# Patient Record
Sex: Female | Born: 1970 | Race: White | Hispanic: No | Marital: Married | Smoking: Never smoker
Health system: Southern US, Community
[De-identification: ages and names within clinical notes are randomized; demographics above are authoritative.]

## PROBLEM LIST (undated history)

## (undated) DIAGNOSIS — L853 Xerosis cutis: Secondary | ICD-10-CM

## (undated) DIAGNOSIS — F39 Unspecified mood [affective] disorder: Secondary | ICD-10-CM

## (undated) DIAGNOSIS — Z9889 Other specified postprocedural states: Secondary | ICD-10-CM

## (undated) DIAGNOSIS — R112 Nausea with vomiting, unspecified: Secondary | ICD-10-CM

## (undated) DIAGNOSIS — B009 Herpesviral infection, unspecified: Secondary | ICD-10-CM

## (undated) DIAGNOSIS — Z72820 Sleep deprivation: Secondary | ICD-10-CM

## (undated) DIAGNOSIS — Z860100 Personal history of colon polyps, unspecified: Secondary | ICD-10-CM

## (undated) DIAGNOSIS — E079 Disorder of thyroid, unspecified: Secondary | ICD-10-CM

## (undated) HISTORY — PX: WISDOM TOOTH EXTRACTION: SHX21

## (undated) HISTORY — DX: Personal history of colon polyps, unspecified: Z86.0100

## (undated) HISTORY — DX: Other specified postprocedural states: Z98.890

## (undated) HISTORY — DX: Herpesviral infection, unspecified: B00.9

## (undated) HISTORY — DX: Nausea with vomiting, unspecified: R11.2

## (undated) HISTORY — PX: BREAST ENHANCEMENT SURGERY: SHX7

## (undated) HISTORY — DX: Unspecified mood (affective) disorder: F39

## (undated) HISTORY — DX: Xerosis cutis: L85.3

## (undated) HISTORY — DX: Personal history of colonic polyps: Z86.010

## (undated) HISTORY — PX: NASAL POLYP SURGERY: SHX186

## (undated) HISTORY — DX: Disorder of thyroid, unspecified: E07.9

## (undated) HISTORY — DX: Sleep deprivation: Z72.820

---

## 1994-07-04 HISTORY — PX: OTHER SURGICAL HISTORY: SHX169

## 1996-07-04 HISTORY — PX: OTHER SURGICAL HISTORY: SHX169

## 1998-08-10 ENCOUNTER — Other Ambulatory Visit: Admission: RE | Admit: 1998-08-10 | Discharge: 1998-08-10 | Payer: Self-pay | Admitting: Obstetrics and Gynecology

## 1998-11-13 ENCOUNTER — Other Ambulatory Visit: Admission: RE | Admit: 1998-11-13 | Discharge: 1998-11-13 | Payer: Self-pay | Admitting: Obstetrics and Gynecology

## 2000-01-27 ENCOUNTER — Other Ambulatory Visit: Admission: RE | Admit: 2000-01-27 | Discharge: 2000-01-27 | Payer: Self-pay | Admitting: Obstetrics and Gynecology

## 2000-09-07 ENCOUNTER — Inpatient Hospital Stay (HOSPITAL_COMMUNITY): Admission: AD | Admit: 2000-09-07 | Discharge: 2000-09-11 | Payer: Self-pay | Admitting: Obstetrics and Gynecology

## 2000-11-13 ENCOUNTER — Ambulatory Visit (HOSPITAL_BASED_OUTPATIENT_CLINIC_OR_DEPARTMENT_OTHER): Admission: RE | Admit: 2000-11-13 | Discharge: 2000-11-13 | Payer: Self-pay | Admitting: Specialist

## 2000-11-13 ENCOUNTER — Encounter (INDEPENDENT_AMBULATORY_CARE_PROVIDER_SITE_OTHER): Payer: Self-pay | Admitting: *Deleted

## 2002-09-04 ENCOUNTER — Other Ambulatory Visit: Admission: RE | Admit: 2002-09-04 | Discharge: 2002-09-04 | Payer: Self-pay | Admitting: Obstetrics and Gynecology

## 2002-09-05 ENCOUNTER — Other Ambulatory Visit: Admission: RE | Admit: 2002-09-05 | Discharge: 2002-09-05 | Payer: Self-pay | Admitting: Obstetrics and Gynecology

## 2003-03-14 ENCOUNTER — Other Ambulatory Visit: Admission: RE | Admit: 2003-03-14 | Discharge: 2003-03-14 | Payer: Self-pay | Admitting: Obstetrics and Gynecology

## 2004-03-25 ENCOUNTER — Other Ambulatory Visit: Admission: RE | Admit: 2004-03-25 | Discharge: 2004-03-25 | Payer: Self-pay | Admitting: Obstetrics and Gynecology

## 2004-09-08 ENCOUNTER — Ambulatory Visit: Payer: Self-pay | Admitting: Internal Medicine

## 2004-09-20 ENCOUNTER — Ambulatory Visit: Payer: Self-pay | Admitting: Internal Medicine

## 2004-12-13 ENCOUNTER — Ambulatory Visit: Payer: Self-pay | Admitting: Internal Medicine

## 2004-12-20 ENCOUNTER — Ambulatory Visit: Payer: Self-pay | Admitting: Internal Medicine

## 2005-02-24 ENCOUNTER — Other Ambulatory Visit: Admission: RE | Admit: 2005-02-24 | Discharge: 2005-02-24 | Payer: Self-pay | Admitting: Obstetrics and Gynecology

## 2005-08-05 ENCOUNTER — Ambulatory Visit: Payer: Self-pay | Admitting: Internal Medicine

## 2005-10-25 ENCOUNTER — Ambulatory Visit: Payer: Self-pay | Admitting: Internal Medicine

## 2006-06-01 ENCOUNTER — Ambulatory Visit: Payer: Self-pay | Admitting: Internal Medicine

## 2006-08-09 ENCOUNTER — Ambulatory Visit: Payer: Self-pay | Admitting: Internal Medicine

## 2006-12-01 ENCOUNTER — Ambulatory Visit: Payer: Self-pay | Admitting: Internal Medicine

## 2006-12-01 LAB — CONVERTED CEMR LAB
ALT: 14 units/L (ref 0–40)
AST: 20 units/L (ref 0–37)
Albumin: 4.2 g/dL (ref 3.5–5.2)
Basophils Relative: 0 % (ref 0.0–1.0)
Bilirubin, Direct: 0.1 mg/dL (ref 0.0–0.3)
Calcium: 9.4 mg/dL (ref 8.4–10.5)
Glucose, Bld: 94 mg/dL (ref 70–99)
HCT: 40.1 % (ref 36.0–46.0)
Hemoglobin: 13.8 g/dL (ref 12.0–15.0)
Lymphocytes Relative: 43.8 % (ref 12.0–46.0)
MCHC: 34.4 g/dL (ref 30.0–36.0)
MCV: 94.5 fL (ref 78.0–100.0)
Monocytes Absolute: 0.3 10*3/uL (ref 0.2–0.7)
Neutro Abs: 1.3 10*3/uL — ABNORMAL LOW (ref 1.4–7.7)
Potassium: 4.2 meq/L (ref 3.5–5.1)
RBC: 4.24 M/uL (ref 3.87–5.11)
Sodium: 143 meq/L (ref 135–145)
TSH: 2.83 microintl units/mL (ref 0.35–5.50)
Total Bilirubin: 0.7 mg/dL (ref 0.3–1.2)
Total Protein: 7.1 g/dL (ref 6.0–8.3)

## 2006-12-11 ENCOUNTER — Ambulatory Visit: Payer: Self-pay | Admitting: Internal Medicine

## 2007-03-02 ENCOUNTER — Ambulatory Visit: Payer: Self-pay | Admitting: Internal Medicine

## 2007-03-02 DIAGNOSIS — M545 Low back pain, unspecified: Secondary | ICD-10-CM | POA: Insufficient documentation

## 2007-03-02 DIAGNOSIS — L509 Urticaria, unspecified: Secondary | ICD-10-CM | POA: Insufficient documentation

## 2007-03-02 LAB — CONVERTED CEMR LAB
Bilirubin Urine: NEGATIVE
Glucose, Urine, Semiquant: NEGATIVE
Ketones, urine, test strip: NEGATIVE
Protein, U semiquant: NEGATIVE
Specific Gravity, Urine: <1.005
Urobilinogen, UA: 0.2

## 2007-04-20 ENCOUNTER — Telehealth (INDEPENDENT_AMBULATORY_CARE_PROVIDER_SITE_OTHER): Payer: Self-pay | Admitting: *Deleted

## 2007-07-16 ENCOUNTER — Ambulatory Visit: Payer: Self-pay | Admitting: Internal Medicine

## 2007-07-16 DIAGNOSIS — R519 Headache, unspecified: Secondary | ICD-10-CM | POA: Insufficient documentation

## 2007-07-16 DIAGNOSIS — R51 Headache: Secondary | ICD-10-CM | POA: Insufficient documentation

## 2007-07-16 DIAGNOSIS — R509 Fever, unspecified: Secondary | ICD-10-CM | POA: Insufficient documentation

## 2007-07-16 LAB — CONVERTED CEMR LAB: Inflenza A Ag: NEGATIVE

## 2007-09-17 ENCOUNTER — Ambulatory Visit: Payer: Self-pay | Admitting: Internal Medicine

## 2007-09-17 DIAGNOSIS — R946 Abnormal results of thyroid function studies: Secondary | ICD-10-CM | POA: Insufficient documentation

## 2007-09-17 DIAGNOSIS — R5381 Other malaise: Secondary | ICD-10-CM | POA: Insufficient documentation

## 2007-09-17 DIAGNOSIS — R5383 Other fatigue: Secondary | ICD-10-CM

## 2007-09-17 DIAGNOSIS — M549 Dorsalgia, unspecified: Secondary | ICD-10-CM | POA: Insufficient documentation

## 2007-09-17 LAB — CONVERTED CEMR LAB
Bilirubin Urine: NEGATIVE
Glucose, Urine, Semiquant: NEGATIVE
Nitrite: NEGATIVE
Thyroglobulin Ab: 34.4 (ref 0.0–60.0)
Urobilinogen, UA: 0.2

## 2007-09-21 ENCOUNTER — Telehealth: Payer: Self-pay | Admitting: *Deleted

## 2007-09-21 LAB — CONVERTED CEMR LAB
Basophils Relative: 0.2 % (ref 0.0–1.0)
Eosinophils Absolute: 0.2 10*3/uL (ref 0.0–0.6)
Eosinophils Relative: 3 % (ref 0.0–5.0)
Neutro Abs: 3.6 10*3/uL (ref 1.4–7.7)
Neutrophils Relative %: 64.4 % (ref 43.0–77.0)
Platelets: 272 10*3/uL (ref 150–400)
T3, Free: 2.5 pg/mL (ref 2.3–4.2)
TSH: 6.64 microintl units/mL — ABNORMAL HIGH (ref 0.35–5.50)

## 2007-10-03 ENCOUNTER — Ambulatory Visit: Payer: Self-pay | Admitting: Internal Medicine

## 2008-02-08 ENCOUNTER — Ambulatory Visit: Payer: Self-pay | Admitting: Internal Medicine

## 2008-02-13 LAB — CONVERTED CEMR LAB
Free T4: 0.8 ng/dL (ref 0.6–1.6)
T3, Free: 2.8 pg/mL (ref 2.3–4.2)

## 2008-02-14 ENCOUNTER — Telehealth (INDEPENDENT_AMBULATORY_CARE_PROVIDER_SITE_OTHER): Payer: Self-pay | Admitting: *Deleted

## 2008-05-02 ENCOUNTER — Telehealth: Payer: Self-pay | Admitting: Internal Medicine

## 2009-03-02 ENCOUNTER — Ambulatory Visit: Payer: Self-pay | Admitting: Internal Medicine

## 2009-03-02 LAB — CONVERTED CEMR LAB
AST: 27 units/L (ref 0–37)
Alkaline Phosphatase: 42 units/L (ref 39–117)
Basophils Relative: 0.4 % (ref 0.0–3.0)
Chloride: 105 meq/L (ref 96–112)
Cholesterol: 127 mg/dL (ref 0–200)
Creatinine, Ser: 0.8 mg/dL (ref 0.4–1.2)
GFR calc non Af Amer: 85.84 mL/min (ref >60)
Glucose, Bld: 93 mg/dL (ref 70–99)
Ketones, urine, test strip: NEGATIVE
LDL Cholesterol: 68 mg/dL (ref 0–99)
Monocytes Absolute: 0.3 10*3/uL (ref 0.1–1.0)
Monocytes Relative: 8.8 % (ref 3.0–12.0)
Neutro Abs: 1.2 10*3/uL — ABNORMAL LOW (ref 1.4–7.7)
Neutrophils Relative %: 43.9 % (ref 43.0–77.0)
Nitrite: NEGATIVE
Platelets: 193 10*3/uL (ref 150.0–400.0)
Protein, U semiquant: NEGATIVE
Sodium: 140 meq/L (ref 135–145)
Specific Gravity, Urine: 1.02
Total Bilirubin: 0.7 mg/dL (ref 0.3–1.2)
Total CHOL/HDL Ratio: 3
Total Protein: 6.8 g/dL (ref 6.0–8.3)
VLDL: 8.4 mg/dL (ref 0.0–40.0)
WBC Urine, dipstick: NEGATIVE
WBC: 2.9 10*3/uL — ABNORMAL LOW (ref 4.5–10.5)
pH: 7

## 2009-03-10 ENCOUNTER — Ambulatory Visit: Payer: Self-pay | Admitting: Internal Medicine

## 2009-03-10 DIAGNOSIS — D72819 Decreased white blood cell count, unspecified: Secondary | ICD-10-CM | POA: Insufficient documentation

## 2009-03-10 DIAGNOSIS — L851 Acquired keratosis [keratoderma] palmaris et plantaris: Secondary | ICD-10-CM | POA: Insufficient documentation

## 2009-04-29 ENCOUNTER — Ambulatory Visit: Payer: Self-pay | Admitting: Internal Medicine

## 2009-05-04 LAB — CONVERTED CEMR LAB
Basophils Relative: 0.1 % (ref 0.0–3.0)
Eosinophils Absolute: 0.1 10*3/uL (ref 0.0–0.7)
Eosinophils Relative: 1 % (ref 0.0–5.0)
HCT: 42.2 % (ref 36.0–46.0)
Lymphs Abs: 1.5 10*3/uL (ref 0.7–4.0)
MCHC: 33.8 g/dL (ref 30.0–36.0)
MCV: 96.4 fL (ref 78.0–100.0)
Monocytes Absolute: 0.2 10*3/uL (ref 0.1–1.0)
Neutro Abs: 4.6 10*3/uL (ref 1.4–7.7)
RBC: 4.38 M/uL (ref 3.87–5.11)
WBC: 6.4 10*3/uL (ref 4.5–10.5)

## 2010-03-11 ENCOUNTER — Ambulatory Visit: Payer: Self-pay | Admitting: Internal Medicine

## 2010-03-11 LAB — CONVERTED CEMR LAB
Alkaline Phosphatase: 45 units/L (ref 39–117)
Basophils Absolute: 0 10*3/uL (ref 0.0–0.1)
Basophils Relative: 0.4 % (ref 0.0–3.0)
Bilirubin, Direct: 0.2 mg/dL (ref 0.0–0.3)
Blood in Urine, dipstick: NEGATIVE
CO2: 31 meq/L (ref 19–32)
Calcium: 9.5 mg/dL (ref 8.4–10.5)
Creatinine, Ser: 0.8 mg/dL (ref 0.4–1.2)
Eosinophils Absolute: 0.1 10*3/uL (ref 0.0–0.7)
GFR calc non Af Amer: 89.22 mL/min (ref >60)
HDL: 49.7 mg/dL (ref >39.00)
Ketones, urine, test strip: NEGATIVE
Lymphocytes Relative: 39.9 % (ref 12.0–46.0)
MCHC: 33.3 g/dL (ref 30.0–36.0)
Monocytes Relative: 7.1 % (ref 3.0–12.0)
Neutrophils Relative %: 48.9 % (ref 43.0–77.0)
Nitrite: NEGATIVE
Protein, U semiquant: NEGATIVE
RBC: 4.16 M/uL (ref 3.87–5.11)
RDW: 12.8 % (ref 11.5–14.6)
Specific Gravity, Urine: 1.01
Total CHOL/HDL Ratio: 3
Triglycerides: 60 mg/dL (ref 0.0–149.0)
Urobilinogen, UA: 0.2
VLDL: 12 mg/dL (ref 0.0–40.0)

## 2010-03-17 ENCOUNTER — Ambulatory Visit: Payer: Self-pay | Admitting: Internal Medicine

## 2010-03-17 DIAGNOSIS — J069 Acute upper respiratory infection, unspecified: Secondary | ICD-10-CM | POA: Insufficient documentation

## 2010-06-17 ENCOUNTER — Telehealth: Payer: Self-pay | Admitting: Internal Medicine

## 2010-07-25 ENCOUNTER — Encounter: Payer: Self-pay | Admitting: Obstetrics and Gynecology

## 2010-08-05 NOTE — Assessment & Plan Note (Signed)
Summary: cpx---no pap//ccm   Vital Signs:  Patient profile:   40 year old female Menstrual status:  regular LMP:     02/28/2010 Height:      62.5 inches Weight:      111 pounds BMI:     20.05 Pulse rate:   72 / minute BP sitting:   100 / 60  (right arm) Cuff size:   regular  Vitals Entered By: Romualdo Bolk, CMA Duncan Dull) (March 17, 2010 3:33 PM) CC: CPX no pap- pt has a gyn who does pap-Pt is also having sore throat that started on 9/13. No coughing or congestion. LMP (date): 02/28/2010 LMP - Character: normal Menarche (age onset years): 14   Menses interval (days): 26 Menstrual flow (days): 3-5 Enter LMP: 02/28/2010 Last PAP Result normal   History of Present Illness: Toni Bradley comes in today  for preventive visit .  Since last visit  here  there have been no major changes in health status  . she is on an organic diet and exercising but no or little dairy .  has lost some weight feels ok but today has st and runny nose like a cold .   Preventive Care Screening  Pap Smear:    Date:  03/04/2009    Results:  normal    Preventive Screening-Counseling & Management  Alcohol-Tobacco     Alcohol drinks/day: <1     Alcohol type: beer     Smoking Status: never  Caffeine-Diet-Exercise     Caffeine use/day: 1-2     Does Patient Exercise: yes  Hep-HIV-STD-Contraception     Dental Visit-last 6 months yes     Sun Exposure-Excessive: yes  Safety-Violence-Falls     Seat Belt Use: yes     Firearms in the Home: no firearms in the home     Smoke Detectors: yes  Contraindications/Deferment of Procedures/Staging:    Test/Procedure: FLU VAX    Reason for deferment: patient declined     Test/Procedure: TD vaccine    Reason for deferment: declined   Current Medications (verified): 1)  Valtrex 500 Mg  Tabs (Valacyclovir Hcl) .... Take 1 Tablet By Mouth Two Times A Day As Needed 2)  Lac-Hydrin Five 5 % Lotn (Ammonium Lactate) .... Apply Two Times A Day As A  Moisturizer 3)  Lunesta 3 Mg Tabs (Eszopiclone) .[name].. 1 By Mouth Hs For Sleep If Needed  Allergies (verified): 1)  ! Macrobid  Past History:  Past medical, surgical, family and social histories (including risk factors) reviewed, and no changes noted (except as noted below).  Past Medical History: Nasal polyps childbirth mood Sleep problem  Dry skin   Orolabial HSV recurrent  Past Surgical History: Reviewed history from 03/10/2009 and no changes required. Ovarian cyst sx-96 Childbirth (vag)-98 Nasal polypectomy   Past History:  Care Management: Gynecology: Edward Jolly Dermatology: Terri Piedra  Family History: Reviewed history from 10/03/2007 and no changes required. Family History Diabetes 1st degree relative ? fam hx of thyroid disease  parathyroid disese Neg   for   skin cancer  Social History: Reviewed history from 03/10/2009 and no changes required.  Never Smoked Alcohol use-no Drug use-no Regular exercise-yes Married with children   going to school  ...job Clinical cytogeneticist Care w/in 6 mos.:  yes Sun Exposure-Excessive:  yes  Review of Systems       12 system review  neg except for st and rhinorrhea today  nonunusual infections has dry skin     Physical Exam  General:  Well-developed,well-nourished,in no acute distress; alert,appropriate and cooperative throughout examination mild congesetion  Head:  normocephalic and atraumatic.   Eyes:  clear  Ears:  R ear normal, L ear normal, and no external deformities.   Nose:  no external deformity.  clear discharge  Mouth:  good dentition.  milkd erythema no lesions or edema Neck:  No deformities, masses, or tenderness noted. Breasts:  No mass, nodules, thickening, tenderness, bulging, retraction, inflamation, nipple discharge or skin changes noted.   well healed scar  Lungs:  Normal respiratory effort, chest expands symmetrically. Lungs are clear to auscultation, no crackles or wheezes.no dullness.   Heart:  Normal  rate and regular rhythm. S1 and S2 normal without gallop, murmur, click, rub or other extra sounds.no lifts.   Abdomen:  Bowel sounds positive,abdomen soft and non-tender without masses, organomegaly or hernias noted. Genitalia:  per gyne  Msk:  no joint swelling, no joint warmth, no redness over joints, and no joint deformities.   Pulses:  pulses intact without delay   Extremities:  no clubbing cyanosis or edema  Neurologic:  alert & oriented X3, strength normal in all extremities, gait normal, and DTRs symmetrical and normal.   Skin:  turgor normal, color normal, no petechiae, and no purpura.    multiple small moles on legs  and some on arms  some very dark but flat and 2-3 mm   no irregulariry  Cervical Nodes:  No lymphadenopathy noted Axillary Nodes:  No palpable lymphadenopathy Inguinal Nodes:  No significant adenopathy Psych:  Normal eye contact, appropriate affect. Cognition appears normal.    Impression & Recommendations:  Problem # 1:  PREVENTIVE HEALTH CARE (ICD-V70.0) Discussed nutrition,exercise,diet,healthy weight, vitamin D and calcium.        skin surveillance and protection declines flu shot and vaccine  Problem # 2:  LEUKOPENIA, MILD (ICD-288.50) off and  on  reviewed record   up and down    Problem # 3:  URI (ICD-465.9) prob viral . Expectant management   Complete Medication List: 1)  Valtrex 500 Mg Tabs (Valacyclovir hcl) .... Take 1 tablet by mouth two times a day as needed 2)  Lac-hydrin Five 5 % Lotn (Ammonium lactate) .... Apply two times a day as a moisturizer 3)  Lunesta 3 Mg Tabs (Eszopiclone) .[name].. 1 by mouth hs for sleep if needed  Patient Instructions: 1)  Look into calcium rich foods  .    2)  Continue  to exercise and sleep. 3)  No tanning beds consider  have derm check your moles.  4)  will check you cbc and thyroid yearly .

## 2010-08-05 NOTE — Progress Notes (Signed)
Summary: cough  Phone Note Call from Patient Call back at Work Phone [phone]   Caller: Patient----triage vm Reason for Call: Talk to Nurse Complaint: Cough/Sore throat Summary of Call: Patient stated that she has a cold and is not sick enough to come in for a visit. Requesting cough syrup. Wants a call back from nurse. Initial call taken by: Warnell Forester,  June 17, 2010 9:58 AM  Follow-up for Phone Call        Pt is having a sore throat, coughing at night, some congestion. No fever. Pt hasn't try Delsym and mucinex. No sinus pressure. Pt is going to try delsym and mucinex first because she doesn't want to come in. If she starts to run a fever or feels like it's going into bronchitis, then she will make an appt. Follow-up by: Romualdo Bolk, CMA Duncan Dull),  June 17, 2010 10:39 AM  Additional Follow-up for Phone Call Additional follow up Details #1::        agree Additional Follow-up by: Madelin Headings MD,  June 17, 2010 12:32 PM

## 2010-10-13 ENCOUNTER — Telehealth: Payer: Self-pay | Admitting: Internal Medicine

## 2010-10-13 DIAGNOSIS — R5383 Other fatigue: Secondary | ICD-10-CM

## 2010-10-13 NOTE — Telephone Encounter (Signed)
Pt would like tsh blood work. Can I sch?

## 2010-10-13 NOTE — Telephone Encounter (Signed)
According to last cpx- Dr. Fabian Sharp states that we can check her cbc and tsh yearly. Last tsh was done on 03/11/10 it was 2.81 Pt aware of this but it states that she is feeling sluggish and fatigue. So she would like to have her tsh checked if possible. Pt aware that md is out until Monday.

## 2010-10-14 NOTE — Telephone Encounter (Signed)
Ok to do TSH  And free T4    And CBC diff for fatigue.

## 2010-10-14 NOTE — Telephone Encounter (Signed)
Left message to call back  

## 2010-10-14 NOTE — Telephone Encounter (Signed)
Left message on machine that I was going to put the orders in the computer and that she could call back to schedule the lab appt.

## 2010-10-15 ENCOUNTER — Other Ambulatory Visit (INDEPENDENT_AMBULATORY_CARE_PROVIDER_SITE_OTHER): Payer: 59

## 2010-10-15 DIAGNOSIS — R5383 Other fatigue: Secondary | ICD-10-CM

## 2010-10-15 DIAGNOSIS — R5381 Other malaise: Secondary | ICD-10-CM

## 2010-10-15 LAB — CBC WITH DIFFERENTIAL/PLATELET
Basophils Absolute: 0 10*3/uL (ref 0.0–0.1)
Eosinophils Absolute: 0.1 10*3/uL (ref 0.0–0.7)
Lymphocytes Relative: 21.2 % (ref 12.0–46.0)
MCHC: 34.7 g/dL (ref 30.0–36.0)
Neutrophils Relative %: 70.2 % (ref 43.0–77.0)
Platelets: 210 10*3/uL (ref 150.0–400.0)
RDW: 13.4 % (ref 11.5–14.6)

## 2010-10-15 LAB — T4, FREE: Free T4: 0.67 ng/dL (ref 0.60–1.60)

## 2010-10-15 LAB — TSH: TSH: 1.61 u[IU]/mL (ref 0.35–5.50)

## 2010-10-19 ENCOUNTER — Telehealth: Payer: Self-pay | Admitting: Internal Medicine

## 2010-10-19 NOTE — Telephone Encounter (Signed)
Tell patient that her laboratory tests are normal. She can make an appointment to discuss them if she wishes.

## 2010-10-20 NOTE — Telephone Encounter (Signed)
Left message to call back  

## 2010-10-20 NOTE — Telephone Encounter (Signed)
Left message on machine about results. 

## 2010-11-19 NOTE — Op Note (Signed)
[facility]  Patient:    TAWSHA, TERRERO                     MRN: [id number] Proc. Date: 11/13/00 Adm. Date:  [id number] Attending:  Gustavus Messing CC:         Yaakov Guthrie. Shon Hough, M.D. (2)   Operative Report  INDICATIONS:  A 40 year old lady with severe nasal deformity with deviation, as well as septal deviation and hypertrophy of the right and left inferior turbinates.  The patient also has some dorsal osteophytic changes in the right and left nasal regions.  PROCEDURE: 1. Nasoseptoplasty. 2. Reduction of bilateral inferior turbinates. 3. Submucous resection.  SURGEON:  Yaakov Guthrie. Shon Hough, M.D.  ANESTHESIA:  General.  DESCRIPTION OF PROCEDURE:  The patient underwent general anesthesia and was intubated orally.  A prep was done to the face and neck areas with Betadine soap and solution and walled off with sterile towels and drapes, so as to make a sterile field.  The anatomic outlines of the nose were drawn with a marking pen  in the midline, upper, and lower lateral cartilage areas.  Xylocaine 1% with epinephrine was injected locally, a total of 10 cc, for vasoconstriction. Also 4% cocaine packs were placed, a total of 5 cc.  After waiting the appropriate amount of time for vasoconstriction to take place, intracartilaginous incisions were made in the right and left lower lateral cartilage areas.  Excess cartilage was trimmed appropriately.  The incision was then carried over the distal septal area.  The vestibular mucosa was opened down to the mucoperichondrium.  Using a caudal elevator, we were able to dissect bilateral flaps, revealing the cartilaginous as well as the vomer bony portions of the septum.  There were some osteophytic changes that were removed with minor retraction with the small rongeur.  Next, the grossly deviated septum was then transected using a swivel knife.  After this and after proper hemostasis, irrigation  was done.  Next, the skin over the dorsal nose was freed.  The dorsal osteophytes were then rasped using ___ on the rasp areas.  The incision was then made in the right and left piriform aperture areas.  Dissection was carried  down with the caudal elevator.  Next, using the guarded right and left curved osteotomes, the lateral osteotomies were fashioned to secure the nasion back into the midline area.  After proper hemostasis, the examination was then turned to the inferior turbinates.  An incision was made over each turbinate and a submucous resection was fashioned with the caudal elevator, and the right and left inferior turbinates were then reduced significantly using turbinectomy scissors.  After proper hemostasis, then a transfixion stitch of #3-0 Vicryl was placed.  The mucosa was reclosed with #5-0 Vicryl.  Vaseline nasal packs were applied, with a dorsal splint, and drip dressings.  She withstood the procedures very well and was taken to the recovery room in good condition.  The estimated blood loss was less than 50 cc.  COMPLICATIONS:  None. DD:  11/13/00 TD:  11/13/00 Job: [id number]

## 2010-11-19 NOTE — Op Note (Signed)
[facility]  Patient:    CORLEY, KOHLS                     MRN: [id number] Proc. Date: 09/08/00 Adm. Date:  [id number] Attending:  Mickle Mallory                           Operative Report  PATIENTS AGE IS 40.  PREOPERATIVE DIAGNOSES:       1. Term pregnancy.                               2. Dysfunctional labor pattern.                               3. Nonreassuring fetal heart tracing.  POSTOPERATIVE DIAGNOSES:      1. Term pregnancy.                               2. Dysfunctional labor pattern.                               3. Nonreassuring fetal heart tracing.                               4. Delivery of 8-pound 10-ounce female infant,                                  Apgars 9 and 9. Arterial cord pH 7.31.  PROCEDURE:                    Primary low transverse cesarean section.  SURGEON:                      Gerrit Friends. Aldona Bar, M.D.  ANESTHESIA:                   Epidural.  ESTIMATED BLOOD LOSS:         500 cc.  HISTORY:                      This 40 year old patient presented on the evening March 7 with spontaneous rupture of membranes at term. She was in early labor at the time. She had positive group B strep with her first pregnancy. This pregnancy was otherwise benign.  At the time of admission, fetal heart rate was reactive, cervix was 3 cm dilated with a vertex and -1 station and obvious clear fluid with amniotomy.  The patient was admitted and had somewhat slower than expected progression. By approximately 4 a.m., the cervix was 5 cm dilated, vertex at 0 station, and there was a baseline shift in the fetal heart rate to approximately 110 from what previously been noted as 135-140. There were also some early decelerations. A scalp electrode and pressure catheter were placed and there was acceleration of the fetal heart rate with scalp stimulation. The patient by this time already had received her epidural. The patient continued to  have very slow sporadic progression to approximately 8 cm of dilation by 6:30 a.m. The vertex  was at 0 to +1 station. Again, there was a dramatic baseline shift of the fetal heart into the 80-90 range with occasional 70-80 periods of bradycardia. This persisted for about 10 to 15 minutes and did not respond to scalp stimulation. There were not additional decelerations noted but because of the dysfunctional labor pattern, as well as the now nonreassuring fetal heart rate tracing, the decision was made to proceed with primary low transverse cesarean section. Accordingly, the patient was taken to the operating room. The epidural was augmented in the OR. This took approximately 10 minutes of time and during this time, the fetal heart rate was noted to be in the 120-130 range. She was being monitored continuously once on the OR table.  Once the epidural was adequate, the patient was prepped and draped and the procedure was begun.  DESCRIPTION OF PROCEDURE:     After the patient was adequately prepped and draped with good anesthetic levels documented, a Pfannenstiel incision was made through her ______ and with minimal difficulty, dissected down sharply to and through the fascia in the low transverse fashion without difficulty. Subfascial space was created, muscles separated in the midline, the peritoneum identified and entered appropriately with care taken to avoid the bowel superiorly and the bladder inferiorly. At this time, the vesicouterine peritoneum was incised in a low transverse fashion, pushed off the lower uterine segment with ease, and then using the Metzenbaum scissors, a low transverse incision was made in the uterus, extended with the fingers, and thereafter, delivery of a viable female infant was carried out without difficulty. The infant cried at once. After the cord was clamped and cut the infant was passed off to the awaiting team headed up by Dr. Alison Murray and ultimately was  taken to the nursery in good condition. The infant weighed 8 pounds 10 ounces and Apgars of 9 and 9, and arterial cord pH obtained at time of delivery was 7.31.  Thereafter, the placenta was delivered intact. The uterus was then exteriorized and manually rendered free of all remaining products of conception. The uterine incision was then closed with a single layer of #1 Vicryl in a running locking fashion. During this time good uterine contractility was afforded with slowly given intravenous Pitocin and manual stimulation. There was bleeding noted from the left angle of the uterine incision and this was ultimately rendered hemostatic using figure-of-eight #1 Vicryl sutures. Several additional #1 Vicryl sutures in a figure-of-eight fashion were placed in the midpoint of the incision, again, for hemostasis.  Tubes and ovaries were noted to be normal. At this time the uterus was replaced into the abdomen, the abdomen was lavaged of all free blood and clot, and closure of the abdomen was begun in layers. The abdominoperitoneum was closed with 0 Vicryl in a running fashion and muscle secured with same. Assured of good subfascial hemostasis, the fascia was reapproximated with 0 Vicryl from angle to midline bilaterally. Subcutaneous was then rendered hemostatic and staples were then used to close the skin. A sterile pressure dressing was applied and the patient was transported to recovery room in satisfactory condition having tolerated the procedure well.  In summary, this patient had a very dysfunctional labor pattern with very slow progression. She ultimately had development of a prolonged period of fetal bradycardia which did not respond to scalp stimulation deeming it very nonreassuring which was the primary reason for cesarean section. The patient was delivered of an 8-pound 10-ounce female infant with Apgars of 9 and 9 and  arterial cord pH of 7.31. At the conclusion of the procedure both  mother and baby were doing well in their respective recovery areas. Estimated blood loss 500 cc. All counts correct x 2. DD:  09/08/00 TD:  09/08/00 Job: [id number]

## 2010-11-19 NOTE — Discharge Summary (Signed)
[facility]  Patient:    Toni Bradley, Toni Bradley                     MRN: [id number] Adm. Date:  [id number] Disc. Date: [id number] Attending:  Mickle Mallory                           Discharge Summary  DISCHARGE DIAGNOSES:            1. Term pregnancy, delivered.                                 2. Dysfunctional labor pattern.                                 3. Nonreassuring fetal heart rate tracing.  SECONDARY DIAGNOSES:            None.  OPERATIONS:                     Primary low transverse cesarean section.  COMPLICATIONS:                  None.  DISCHARGE CONDITION:            Good.  HOSPITAL COURSE:                This is a 39 year old gravida 2, para 1, who was admitted on the evening of September 07, 2000, with spontaneous rupture of membranes.  The patient was observed on the labor floor where she had slow progress and dilated only 2 cm over a period of several hours.  Trial of labor was continued but the patients baseline fetal heart rate continued to fall and Dr. Aldona Bar, the doctor in charge of the patient at this time, elected to perform a cesarean section for fetal heart abnormality.  The cesarean went without complication with the delivery of a female infant weighing 8 pounds 10 ounces, Apgar scores 9 and 9, cord pH 7.31.  The patients postoperative course was benign without significant fever or anemia.  On the third postoperative day, the patient was felt to be ready for discharge.  DISCHARGE INSTRUCTIONS:         She was discharged on a regular diet, told to limit activity.  She was given Tylox 20 tablets to take one to two every four hours for pain, take to take over-the-counter pain medications at first to see if this was enough before taking the Tylox.  She was told to take a prenatal vitamin.  She was also asked to return to the office on four weeks for follow-up evaluation.  LABORATORY DATA:                Her admission hemoglobin  was 130.6.  Her discharge hemoglobin was 10.1.  Her white count on discharge was 9.6.  The RPR was nonreactive. DD:  10/11/00 TD:  10/11/00 Job: [id number]

## 2011-05-23 ENCOUNTER — Other Ambulatory Visit (INDEPENDENT_AMBULATORY_CARE_PROVIDER_SITE_OTHER): Payer: 59

## 2011-05-23 DIAGNOSIS — Z0000 Encounter for general adult medical examination without abnormal findings: Secondary | ICD-10-CM

## 2011-05-23 LAB — BASIC METABOLIC PANEL
Calcium: 9 mg/dL (ref 8.4–10.5)
GFR: 77 mL/min (ref >60.00)
Glucose, Bld: 107 mg/dL — ABNORMAL HIGH (ref 70–99)
Sodium: 140 mEq/L (ref 135–145)

## 2011-05-23 LAB — CBC WITH DIFFERENTIAL/PLATELET
Basophils Absolute: 0 10*3/uL (ref 0.0–0.1)
Eosinophils Relative: 3.8 % (ref 0.0–5.0)
Hemoglobin: 13.5 g/dL (ref 12.0–15.0)
Lymphocytes Relative: 35.4 % (ref 12.0–46.0)
Monocytes Relative: 7.7 % (ref 3.0–12.0)
Neutro Abs: 2.3 10*3/uL (ref 1.4–7.7)
RBC: 4.14 Mil/uL (ref 3.87–5.11)
RDW: 13 % (ref 11.5–14.6)
WBC: 4.3 10*3/uL — ABNORMAL LOW (ref 4.5–10.5)

## 2011-05-23 LAB — POCT URINALYSIS DIPSTICK
Leukocytes, UA: NEGATIVE
Protein, UA: NEGATIVE
Urobilinogen, UA: 0.2

## 2011-05-23 LAB — HEPATIC FUNCTION PANEL
AST: 26 U/L (ref 0–37)
Albumin: 4 g/dL (ref 3.5–5.2)
Alkaline Phosphatase: 43 U/L (ref 39–117)
Total Bilirubin: 0.5 mg/dL (ref 0.3–1.2)

## 2011-05-23 LAB — TSH: TSH: 2.98 u[IU]/mL (ref 0.35–5.50)

## 2011-05-23 LAB — LIPID PANEL
LDL Cholesterol: 58 mg/dL (ref 0–99)
VLDL: 10.2 mg/dL (ref 0.0–40.0)

## 2011-06-01 ENCOUNTER — Encounter: Payer: Self-pay | Admitting: Internal Medicine

## 2011-06-01 ENCOUNTER — Ambulatory Visit (INDEPENDENT_AMBULATORY_CARE_PROVIDER_SITE_OTHER): Payer: 59 | Admitting: Internal Medicine

## 2011-06-01 VITALS — BP 102/68 | HR 74 | Temp 98.3°F | Ht 62.5 in | Wt 115.0 lb

## 2011-06-01 DIAGNOSIS — J069 Acute upper respiratory infection, unspecified: Secondary | ICD-10-CM

## 2011-06-01 DIAGNOSIS — B009 Herpesviral infection, unspecified: Secondary | ICD-10-CM | POA: Insufficient documentation

## 2011-06-01 DIAGNOSIS — L851 Acquired keratosis [keratoderma] palmaris et plantaris: Secondary | ICD-10-CM

## 2011-06-01 DIAGNOSIS — Z0000 Encounter for general adult medical examination without abnormal findings: Secondary | ICD-10-CM | POA: Insufficient documentation

## 2011-06-01 DIAGNOSIS — R7301 Impaired fasting glucose: Secondary | ICD-10-CM

## 2011-06-01 DIAGNOSIS — D72819 Decreased white blood cell count, unspecified: Secondary | ICD-10-CM

## 2011-06-01 LAB — POCT CBG (FASTING - GLUCOSE)-MANUAL ENTRY: Glucose Fasting, POC: 88 mg/dL (ref 70–99)

## 2011-06-01 MED ORDER — VALACYCLOVIR HCL 500 MG PO TABS: 500.0000 mg | ORAL_TABLET | Freq: Two times a day (BID) | ORAL | Status: DC

## 2011-06-01 MED ORDER — AMMONIUM LACTATE 5 % EX LOTN: 1.0000 "application " | TOPICAL_LOTION | Freq: Two times a day (BID) | CUTANEOUS | Status: DC

## 2011-06-01 NOTE — Patient Instructions (Signed)
Continue lifestyle intervention healthy eating and exercise . Can check early blood sugar   As discussed

## 2011-06-01 NOTE — Progress Notes (Signed)
Subjective:    Patient ID: Toni Bradley, female    DOB: July 27, 1970, 40 y.o.   MRN: [id number]  HPI  Patient comes in today for a checkup. She's done pretty well since her last visit sees her OB/GYN Dr. Edward Jolly recently with Pap smear. She has had a few days of an upper respiratory infection like a head cold with nasal congestion and pressure in her ear.; taking vitamin C no fever chest pain or shortness of breath. She needs a refill on her Valtrex as needed and the Lac-Hydrin for dry skin. She's not using her sleep aid anytime recently. She exercises on a regular basis and has no cardiovascular or pulmonary symptoms with this.  Review of Systems ROS:  GEN/ HEENTNo fever, significant weight changes sweats headaches vision problems hearing changes, CV/ PULM; No chest pain shortness of breath cough, syncope,edema  change in exercise tolerance. GI /GU: No adominal pain, vomiting, change in bowel habits. No blood in the stool. No significant GU symptoms. SKIN/HEME: ,no acute skin rashes suspicious lesions or bleeding. No lymphadenopathy, nodules, masses.  NEURO/ PSYCH:  No neurologic signs such as weakness numbness No depression anxiety. IMM/ Allergy: No unusual infections.  Allergy .  Upper respiratory symptoms as per history of present illness REST of 12 system review negative  Past Medical History  Diagnosis Date  . Nasal polyps   . Mood disorder   . Problems related to lack of adequate sleep   . Dry skin   . Recurrent HSV (herpes simplex virus)     History   Social History  . Marital Status: Married    Spouse Name: N/A    Number of Children: N/A  . Years of Education: N/A   Occupational History  . Not on file.   Social History Main Topics  . Smoking status: Never Smoker   . Smokeless tobacco: Never Used  . Alcohol Use: Yes     occ.  . Drug Use: Not on file  . Sexually Active: Not on file   Other Topics Concern  . 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 No ets.     Past Surgical History  Procedure Date  . Ovarian cyst sx 1996  . Childbirth 1998    vaginal  . Nasal polyp surgery      Family History  Problem Relation Age of Onset  . Diabetes Other   . Thyroid disease Other     Allergies  Allergen Reactions  . Nitrofurantoin     REACTION: hives 3-4 days off medication    Current Outpatient Prescriptions on File Prior to Visit  Medication Sig Dispense Refill  . ammonium lactate (LAC-HYDRIN FIVE) 5 % LOTN lotion Apply 1 application topically 2 (two) times daily.  222 Bottle  3    BP 102/68  Pulse 74  Temp(Src) 98.3 F (36.8 C) (Oral)  Wt 115 lb (52.164 kg)  SpO2 98%  LMP 05/06/2011       Objective:   Physical Exam Physical Exam: Vital signs reviewed WUJ:WJXB is a well-developed well-nourished alert cooperative  white female who appears her stated age in no acute distress. Mildly congested  HEENT: normocephalic  traumatic , Eyes: PERRL EOM's full, conjunctiva clear, Nares: paten,t no deformity mild clear discharge., Ears: no deformity EAC's clear TMs with normal landmarks. +2 wax in right ear TM is gray Mouth: clear OP, no lesions, edema.  Moist  mucous membranes. Dentition in adequate repair. NECK: supple without masses, thyromegaly or bruits. CHEST/PULM:  Clear to auscultation and percussion breath sounds equal no wheeze , rales or rhonchi. No chest wall deformities or tenderness. CV: PMI is nondisplaced, S1 S2 no gallops, murmurs, rubs. Peripheral pulses are full without delay.No JVD .  ABDOMEN: Bowel sounds normal nontender  No guard or rebound, no hepato splenomegal no CVA tenderness.  No hernia. Extremtities:  No clubbing cyanosis or edema, no acute joint swelling or redness no focal atrophy NEURO:  Oriented x3, cranial nerves 3-12 appear to be intact, no obvious focal weakness,gait within normal limits no abnormal reflexes or  asymmetrical SKIN: No acute rashes normal turgor, color, no bruising or petechiae. PSYCH: Oriented, good eye contact, no obvious depression anxiety, cognition and judgment appear normal.  Lab Results  Component Value Date   WBC 4.3* 05/23/2011   HGB 13.5 05/23/2011   HCT 39.6 05/23/2011   PLT 233.0 05/23/2011   GLUCOSE 107* 05/23/2011   CHOL 125 05/23/2011   TRIG 51.0 05/23/2011   HDL 56.60 05/23/2011   LDLCALC 58 05/23/2011   ALT 19 05/23/2011   AST 26 05/23/2011   NA 140 05/23/2011   K 4.5 05/23/2011   CL 107 05/23/2011   CREATININE 0.9 05/23/2011   BUN 15 05/23/2011   CO2 24 05/23/2011   TSH 2.98 05/23/2011        Assessment & Plan:  Preventive Health Care Continue lifestyle intervention healthy eating and exercise . Counseled regarding healthy nutrition, exercise, sleep, injury prevention, calcium vit d and healthy weight .   Declined flu shot today  URI uncomplicated  Sleep better  Elevated FBG x 1  No risk PP nl today   Just check yearly  Cold sore hx  Refill med Dry skin refill med . WBC ok range

## 2011-07-13 ENCOUNTER — Telehealth: Payer: Self-pay | Admitting: Internal Medicine

## 2011-07-13 MED ORDER — CYCLOBENZAPRINE HCL 10 MG PO TABS: 10.0000 mg | ORAL_TABLET | Freq: Three times a day (TID) | ORAL | Status: AC | PRN

## 2011-07-13 NOTE — Telephone Encounter (Signed)
Spoke to pt- lower back pain. Pt woke up at 3am and couldn't even move. No injury. Back is stiff. Pt is wanting [place] to call in flexeril. She states that if it doesn't get any better that she will call back to schedule an appt.

## 2011-07-13 NOTE — Telephone Encounter (Signed)
Pt is having back pain requesting same medication as last yr refill call into [facility]

## 2011-07-13 NOTE — Telephone Encounter (Addendum)
Flexeril 10mg  #30 no refills- per Dr. Fabian Sharp. Rx sent to pharmacy

## 2011-08-21 ENCOUNTER — Ambulatory Visit (INDEPENDENT_AMBULATORY_CARE_PROVIDER_SITE_OTHER): Payer: 59 | Admitting: Family Medicine

## 2011-08-21 ENCOUNTER — Ambulatory Visit: Payer: 59

## 2011-08-21 VITALS — BP 97/62 | HR 79 | Temp 98.2°F | Resp 12 | Ht 64.0 in | Wt 120.0 lb

## 2011-08-21 DIAGNOSIS — R002 Palpitations: Secondary | ICD-10-CM

## 2011-08-21 DIAGNOSIS — R05 Cough: Secondary | ICD-10-CM

## 2011-08-21 DIAGNOSIS — R059 Cough, unspecified: Secondary | ICD-10-CM

## 2011-08-21 DIAGNOSIS — J069 Acute upper respiratory infection, unspecified: Secondary | ICD-10-CM

## 2011-08-21 MED ORDER — HYDROCOD POLST-CHLORPHEN POLST 10-8 MG/5ML PO LQCR: 5.0000 mL | Freq: Two times a day (BID) | ORAL | Status: AC | PRN

## 2011-08-21 MED ORDER — AZITHROMYCIN 250 MG PO TABS: ORAL_TABLET | ORAL | Status: AC

## 2011-08-21 NOTE — Progress Notes (Signed)
[facility]:  Office Visit  Chief Complaint:  Chief Complaint  Patient presents with  . Cough  . chest congestion    HPI: Toni Bradley is a 41 y.o. female who complains of  Chest congestion x 1 week, with palpitations. H/o of irregular heart beat. H/o PNA. Tried Tylenol cold and Sinus without relief. Dry cough  Past Medical History  Diagnosis Date  . Nasal polyps   . Mood disorder   . Problems related to lack of adequate sleep   . Dry skin   . Recurrent HSV (herpes simplex virus)    Past Surgical History  Procedure Date  . Ovarian cyst sx 1996  . Childbirth 1998    vaginal  . Nasal polyp surgery     History   Social History  . Marital Status: Married    Spouse Name: N/A    Number of Children: N/A  . Years of Education: N/A   Social History Main Topics  . Smoking status: Never Smoker   . Smokeless tobacco: Never Used  . Alcohol Use: Yes     occ.  . Drug Use: None  . Sexually Active: None   Other Topics Concern  . 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 No ets.    Family History  Problem Relation Age of Onset  . Diabetes Other   . Thyroid disease Other    Allergies  Allergen Reactions  . Nitrofurantoin     REACTION: hives 3-4 days off medication   Prior to Admission medications   Medication Sig Start Date End Date Taking? Authorizing Provider  ammonium lactate (LAC-HYDRIN FIVE) 5 % LOTN lotion Apply 1 application topically 2 (two) times daily. 06/01/11 05/31/12  Lorretta Harp, MD  Eszopiclone (ESZOPICLONE) 3 MG TABS Take 3 mg by mouth at bedtime. Take immediately before bedtime     Historical Provider, MD  valACYclovir (VALTREX) 500 MG tablet Take 1 tablet (500 mg total) by mouth 2 (two) times daily. 06/01/11   Lorretta Harp, MD     ROS: The patient denies fevers, chills, night sweats, unintentional weight loss, chest pain, wheezing,  dyspnea on exertion, nausea, vomiting, abdominal pain, dysuria, hematuria, melena, numbness, weakness, or tingling.+ palpitations  All other systems have been reviewed and were otherwise negative with the exception of those mentioned in the HPI and as above.    PHYSICAL EXAM: Filed Vitals:   08/21/11 0946  BP: 97/62  Pulse: 79  Temp: 98.2 F (36.8 C)  Resp: 12   Filed Vitals:   08/21/11 0946  Height: 5\' 4"  (1.626 m)  Weight: 120 lb (54.432 kg)   Body mass index is 20.60 kg/(m^2).  General: Alert, no acute distress HEENT:  Normocephalic, atraumatic, oropharynx patent. + mild sinus pressure Cardiovascular:  Regular rate and rhythm, no rubs murmurs or gallops.  No Carotid bruits, radial pulse intact. No pedal edema.  Respiratory: Clear to auscultation bilaterally.  No wheezes, rales, or rhonchi.  No cyanosis, no use of accessory musculature GI: No organomegaly, abdomen is soft and non-tender, positive bowel sounds.  No masses. Skin: No rashes. Neurologic: Facial musculature symmetric. Psychiatric: Patient is appropriate throughout our interaction. Lymphatic: No  cervical lymphadenopathy Musculoskeletal: Gait intact.  EKG/XRAY:   Primary read interpreted by Dr. Conley Rolls at [facility]. 1. EKG was sinus rhythm at 71 BPM , no ST-T wave abnormalities. 2. Chest Xray no pneumo, no infiltrates,  increase hilar LAD.    ASSESSMENT/PLAN: Encounter Diagnoses  Name Primary?  . URI (upper respiratory infection) Yes  . Cough   . Palpitations    1. Most likely viral URI, sxs treatment with cough meds Tussionex. IF no improvement in 3-4 days may take z pack 2. As above for cough 3. EKG  And chst Xray normal. F/u prn if sxs continue or go to ER for increase CP/SOB    Daylan Juhnke PHUONG, DO 08/21/2011 12:03 PM

## 2011-12-14 ENCOUNTER — Telehealth: Payer: Self-pay | Admitting: Internal Medicine

## 2011-12-14 NOTE — Telephone Encounter (Signed)
Pt called req to get a renewal on a Hydrocortisone Cream 2.5% for itching. Pls [facility] to [facility] [address].

## 2011-12-15 NOTE — Telephone Encounter (Signed)
This pt was last seen on 06/01/11 and has a fu on 06/04/12.  Please advise.

## 2011-12-15 NOTE — Telephone Encounter (Signed)
Ok to renew but dont see it on med list  Need more infor and can refill  X 1 to use bid disp 30 gm

## 2011-12-16 ENCOUNTER — Other Ambulatory Visit: Payer: Self-pay | Admitting: Family Medicine

## 2011-12-16 MED ORDER — HYDROCORTISONE 2.5 % EX CREA: TOPICAL_CREAM | Freq: Three times a day (TID) | CUTANEOUS | Status: DC

## 2011-12-16 MED ORDER — HYDROCORTISONE 2.5 % EX CREA: TOPICAL_CREAM | Freq: Three times a day (TID) | CUTANEOUS | Status: AC

## 2011-12-16 MED ORDER — AMMONIUM LACTATE 5 % EX LOTN: 1.0000 "application " | TOPICAL_LOTION | Freq: Two times a day (BID) | CUTANEOUS | Status: DC

## 2011-12-16 NOTE — Telephone Encounter (Signed)
Spoke to the pt by telephone.  Per Dr. Fabian Sharp, medication was sent to the pharmacy.

## 2012-01-13 ENCOUNTER — Telehealth: Payer: Self-pay | Admitting: Internal Medicine

## 2012-01-13 NOTE — Telephone Encounter (Signed)
Needs office visit to discuss problem as I cannot tell from the phone note of why we are checking blood work.

## 2012-01-13 NOTE — Telephone Encounter (Signed)
Called and left message on personal cell that she will need to make an appt to be seen with [facility] before any labs can be ordered.  Left phone number for her to call back.

## 2012-01-13 NOTE — Telephone Encounter (Signed)
What would you like to order?

## 2012-01-13 NOTE — Telephone Encounter (Signed)
Patient called stating that her GYN advised her to have her PCP do a hormonal panel and tsh. Please advise.

## 2012-01-16 ENCOUNTER — Ambulatory Visit (INDEPENDENT_AMBULATORY_CARE_PROVIDER_SITE_OTHER): Payer: 59 | Admitting: Internal Medicine

## 2012-01-16 ENCOUNTER — Encounter: Payer: Self-pay | Admitting: Internal Medicine

## 2012-01-16 VITALS — BP 94/56 | HR 97 | Temp 98.1°F | Wt 114.0 lb

## 2012-01-16 DIAGNOSIS — R110 Nausea: Secondary | ICD-10-CM

## 2012-01-16 DIAGNOSIS — N912 Amenorrhea, unspecified: Secondary | ICD-10-CM | POA: Insufficient documentation

## 2012-01-16 DIAGNOSIS — R197 Diarrhea, unspecified: Secondary | ICD-10-CM

## 2012-01-16 LAB — BASIC METABOLIC PANEL
BUN: 9 mg/dL (ref 6–23)
CO2: 30 mEq/L (ref 19–32)
Calcium: 9 mg/dL (ref 8.4–10.5)
Chloride: 100 mEq/L (ref 96–112)
Creatinine, Ser: 0.8 mg/dL (ref 0.4–1.2)
GFR: 87.05 mL/min (ref >60.00)
Glucose, Bld: 92 mg/dL (ref 70–99)
Potassium: 3.5 mEq/L (ref 3.5–5.1)
Sodium: 138 mEq/L (ref 135–145)

## 2012-01-16 LAB — HEPATIC FUNCTION PANEL
ALT: 14 U/L (ref 0–35)
Bilirubin, Direct: 0 mg/dL (ref 0.0–0.3)
Total Bilirubin: 0.6 mg/dL (ref 0.3–1.2)

## 2012-01-16 LAB — T3, FREE: T3, Free: 2.2 pg/mL — ABNORMAL LOW (ref 2.3–4.2)

## 2012-01-16 NOTE — Progress Notes (Signed)
  Subjective:    Patient ID: Toni Bradley, female    DOB: Nov 06, 1970, 41 y.o.   MRN: [id number]  HPI Pt comesin today for  problem with irregular periods and some fatigue. She also over the last 2 days it has some nausea and watery diarrhea without associated fever or blood. Thinks this is an interim stomach bug. Has minimized by mouth intake over the weekend.  The original problem was change in her. Status. She states they have been regular and her last one was April 22. After that she did not bleed for a most 3 months went to her gynecologist they did blood work in May that was apparently normal but the thyroid was borderline. She went through a progesterone challenge and had withdrawal bleeding light for 2-3 days July 4 and fifth. Uses condoms. GYN office advised check a female hormone panel and a repeat thyroid. She was not anemic and pregnancy was negative. Review of Systems No fever chest pain shortness of breath change in vision visual loss unusual breast discharge. Rest as per history of present illness. Feels like she has a hard time losing weight even though she exercises every day for an hour in the gym. Family history Sr. with thyroid problem and father with parathyroid. Outpatient Encounter Prescriptions as of 01/16/2012  Medication Sig Dispense Refill  . hydrocortisone 2.5 % cream Apply topically 3 (three) times daily.  30 g  0  . valACYclovir (VALTREX) 500 MG tablet Take 1 tablet (500 mg total) by mouth 2 (two) times daily.  60 tablet  2  . DISCONTD: ammonium lactate (LAC-HYDRIN FIVE) 5 % LOTN lotion Apply 1 application topically 2 (two) times daily.  222 Bottle  3  . DISCONTD: Eszopiclone (ESZOPICLONE) 3 MG TABS Take 3 mg by mouth at bedtime. Take immediately before bedtime       Past history family history social history reviewed in the electronic medical record.    Objective:   Physical Exam BP 94/56  Pulse 97  Temp 98.1 F (36.7 C) (Oral)  Wt 114 lb (51.71 kg)  SpO2  98%  LMP 01/05/2012 Well-developed well-nourished in no acute distress looks slightly washed out. HEENT unremarkable OP clear moist mucous membranes. Neck: Supple without adenopathy or masses or bruits: Thyroid appears palpable without nodules Chest:  Clear to A without wheezes rales or rhonchi CV:  S1-S2 no gallops or murmurs peripheral perfusion is normal Abdomen:  Sof,t normal bowel sounds without hepatosplenomegaly, no guarding rebound or masses no CVA tenderness No clubbing cyanosis or edema Skin: normal capillary refill ,turgor , color: No acute rashes ,petechiae or bruising  Last labs 7 months ago.     Assessment & Plan:  Recent amenorrhea given Provera challenge with minor but present withdrawal bleeding. Apparently not from pregnancy History of borderline abnormal thyroid tests Recent GI symptoms felt to be acute gastroenteritis uncomplicated.

## 2012-01-16 NOTE — Patient Instructions (Signed)
Will notify you  of labs when available. Agree with clear liquids until you have improved for this probable viral gastroenteritis. Avoid dehydration. We will plan followup depending on your lab results.   3500 calories is the energy content of a pound of body weight .Must have a 3500 cal deficit to lose one pound . Thus decrease 500 calorie equivalent per day in food or drink intake / or exercise  for 7 days to lose one pound.

## 2012-01-17 LAB — CELIAC PANEL 10
Gliadin IgA: 2.2 U/mL (ref <20)
Gliadin IgG: 3.8 U/mL (ref <20)
IgA: 146 mg/dL (ref 69–380)
Tissue Transglutaminase Ab, IgA: 2.2 U/mL (ref <20)

## 2012-01-17 LAB — THYROID ANTIBODIES: Thyroperoxidase Ab SerPl-aCnc: 39.4 IU/mL — ABNORMAL HIGH (ref <35.0)

## 2012-01-17 LAB — PROLACTIN: Prolactin: 14.6 ng/mL

## 2012-01-18 LAB — FOLLICLE STIMULATING HORMONE: FSH: 5.9 m[IU]/mL

## 2012-01-23 ENCOUNTER — Other Ambulatory Visit: Payer: Self-pay | Admitting: Family Medicine

## 2012-01-23 DIAGNOSIS — IMO0002 Reserved for concepts with insufficient information to code with codable children: Secondary | ICD-10-CM

## 2012-05-28 ENCOUNTER — Other Ambulatory Visit: Payer: 59

## 2012-06-04 ENCOUNTER — Encounter: Payer: 59 | Admitting: Internal Medicine

## 2012-06-04 ENCOUNTER — Encounter: Payer: Self-pay | Admitting: Internal Medicine

## 2012-06-04 NOTE — Progress Notes (Signed)
Pt canceled appt the day of.

## 2012-06-18 ENCOUNTER — Other Ambulatory Visit: Payer: Self-pay | Admitting: Internal Medicine

## 2012-08-01 ENCOUNTER — Other Ambulatory Visit: Payer: Self-pay | Admitting: Obstetrics and Gynecology

## 2012-08-01 DIAGNOSIS — R928 Other abnormal and inconclusive findings on diagnostic imaging of breast: Secondary | ICD-10-CM

## 2012-08-02 ENCOUNTER — Ambulatory Visit
Admission: RE | Admit: 2012-08-02 | Discharge: 2012-08-02 | Disposition: A | Discharge status: Home / Self Care | Payer: 59 | Source: Ambulatory Visit | Attending: Obstetrics and Gynecology | Admitting: Obstetrics and Gynecology

## 2012-08-02 DIAGNOSIS — R928 Other abnormal and inconclusive findings on diagnostic imaging of breast: Secondary | ICD-10-CM

## 2012-08-14 ENCOUNTER — Other Ambulatory Visit (INDEPENDENT_AMBULATORY_CARE_PROVIDER_SITE_OTHER): Payer: 59

## 2012-08-14 DIAGNOSIS — Z0000 Encounter for general adult medical examination without abnormal findings: Secondary | ICD-10-CM

## 2012-08-14 LAB — HEPATIC FUNCTION PANEL
AST: 25 U/L (ref 0–37)
Alkaline Phosphatase: 47 U/L (ref 39–117)
Bilirubin, Direct: 0.1 mg/dL (ref 0.0–0.3)
Total Bilirubin: 0.4 mg/dL (ref 0.3–1.2)

## 2012-08-14 LAB — CBC WITH DIFFERENTIAL/PLATELET
Basophils Absolute: 0 10*3/uL (ref 0.0–0.1)
Basophils Relative: 0.4 % (ref 0.0–3.0)
Eosinophils Absolute: 0.1 10*3/uL (ref 0.0–0.7)
HCT: 40.7 % (ref 36.0–46.0)
Hemoglobin: 13.8 g/dL (ref 12.0–15.0)
Lymphocytes Relative: 39.5 % (ref 12.0–46.0)
Lymphs Abs: 1.4 10*3/uL (ref 0.7–4.0)
MCHC: 33.9 g/dL (ref 30.0–36.0)
MCV: 94.6 fl (ref 78.0–100.0)
Monocytes Absolute: 0.4 10*3/uL (ref 0.1–1.0)
Neutro Abs: 1.7 10*3/uL (ref 1.4–7.7)
RBC: 4.3 Mil/uL (ref 3.87–5.11)
RDW: 12.6 % (ref 11.5–14.6)

## 2012-08-14 LAB — BASIC METABOLIC PANEL
CO2: 29 mEq/L (ref 19–32)
Calcium: 9 mg/dL (ref 8.4–10.5)
Chloride: 103 mEq/L (ref 96–112)
Glucose, Bld: 89 mg/dL (ref 70–99)
Sodium: 138 mEq/L (ref 135–145)

## 2012-08-14 LAB — LIPID PANEL: Total CHOL/HDL Ratio: 3

## 2012-08-14 LAB — TSH: TSH: 3.43 u[IU]/mL (ref 0.35–5.50)

## 2012-08-15 ENCOUNTER — Other Ambulatory Visit: Payer: 59

## 2012-08-22 ENCOUNTER — Encounter: Payer: Self-pay | Admitting: Internal Medicine

## 2012-08-22 ENCOUNTER — Ambulatory Visit (INDEPENDENT_AMBULATORY_CARE_PROVIDER_SITE_OTHER): Payer: 59 | Admitting: Internal Medicine

## 2012-08-22 VITALS — BP 96/64 | HR 86 | Temp 98.3°F | Ht 62.5 in | Wt 115.0 lb

## 2012-08-22 DIAGNOSIS — Z0000 Encounter for general adult medical examination without abnormal findings: Secondary | ICD-10-CM

## 2012-08-22 DIAGNOSIS — D72819 Decreased white blood cell count, unspecified: Secondary | ICD-10-CM

## 2012-08-22 DIAGNOSIS — E063 Autoimmune thyroiditis: Secondary | ICD-10-CM

## 2012-08-22 NOTE — Patient Instructions (Signed)
Continue lifestyle intervention healthy eating and exercise .  Can get updated tdap when you wish. Check year ly  With labs.   Preventive Care for Adults, Female A healthy lifestyle and preventive care can promote health and wellness. Preventive health guidelines for women include the following key practices.  A routine yearly physical is a good way to check with your caregiver about your health and preventive screening. It is a chance to share any concerns and updates on your health, and to receive a thorough exam.  Visit your dentist for a routine exam and preventive care every 6 months. Brush your teeth twice a day and floss once a day. Good oral hygiene prevents tooth decay and gum disease.  The frequency of eye exams is based on your age, health, family medical history, use of contact lenses, and other factors. Follow your caregiver's recommendations for frequency of eye exams.  Eat a healthy diet. Foods like vegetables, fruits, whole grains, low-fat dairy products, and lean protein foods contain the nutrients you need without too many calories. Decrease your intake of foods high in solid fats, added sugars, and salt. Eat the right amount of calories for you.Get information about a proper diet from your caregiver, if necessary.  Regular physical exercise is one of the most important things you can do for your health. Most adults should get at least 150 minutes of moderate-intensity exercise (any activity that increases your heart rate and causes you to sweat) each week. In addition, most adults need muscle-strengthening exercises on 2 or more days a week.  Maintain a healthy weight. The body mass index (BMI) is a screening tool to identify possible weight problems. It provides an estimate of body fat based on height and weight. Your caregiver can help determine your BMI, and can help you achieve or maintain a healthy weight.For adults 20 years and older:  A BMI below 18.5 is considered  underweight.  A BMI of 18.5 to 24.9 is normal.  A BMI of 25 to 29.9 is considered overweight.  A BMI of 30 and above is considered obese.  Maintain normal blood lipids and cholesterol levels by exercising and minimizing your intake of saturated fat. Eat a balanced diet with plenty of fruit and vegetables. Blood tests for lipids and cholesterol should begin at age 43 and be repeated every 5 years. If your lipid or cholesterol levels are high, you are over 50, or you are at high risk for heart disease, you may need your cholesterol levels checked more frequently.Ongoing high lipid and cholesterol levels should be treated with medicines if diet and exercise are not effective.  If you smoke, find out from your caregiver how to quit. If you do not use tobacco, do not start.  If you are pregnant, do not drink alcohol. If you are breastfeeding, be very cautious about drinking alcohol. If you are not pregnant and choose to drink alcohol, do not exceed 1 drink per day. One drink is considered to be 12 ounces (355 mL) of beer, 5 ounces (148 mL) of wine, or 1.5 ounces (44 mL) of liquor.  Avoid use of street drugs. Do not share needles with anyone. Ask for help if you need support or instructions about stopping the use of drugs.  High blood pressure causes heart disease and increases the risk of stroke. Your blood pressure should be checked at least every 1 to 2 years. Ongoing high blood pressure should be treated with medicines if weight loss and exercise are  not effective.  If you are 45 to 41 years old, ask your caregiver if you should take aspirin to prevent strokes.  Diabetes screening involves taking a blood sample to check your fasting blood sugar level. This should be done once every 3 years, after age 87, if you are within normal weight and without risk factors for diabetes. Testing should be considered at a younger age or be carried out more frequently if you are overweight and have at least 1  risk factor for diabetes.  Breast cancer screening is essential preventive care for women. You should practice "breast self-awareness." This means understanding the normal appearance and feel of your breasts and may include breast self-examination. Any changes detected, no matter how small, should be reported to a caregiver. Women in their 50s and 30s should have a clinical breast exam (CBE) by a caregiver as part of a regular health exam every 1 to 3 years. After age 75, women should have a CBE every year. Starting at age 31, women should consider having a mammography (breast X-ray test) every year. Women who have a family history of breast cancer should talk to their caregiver about genetic screening. Women at a high risk of breast cancer should talk to their caregivers about having magnetic resonance imaging (MRI) and a mammography every year.  The Pap test is a screening test for cervical cancer. A Pap test can show cell changes on the cervix that might become cervical cancer if left untreated. A Pap test is a procedure in which cells are obtained and examined from the lower end of the uterus (cervix).  Women should have a Pap test starting at age 84.  Between ages 36 and 54, Pap tests should be repeated every 2 years.  Beginning at age 71, you should have a Pap test every 3 years as long as the past 3 Pap tests have been normal.  Some women have medical problems that increase the chance of getting cervical cancer. Talk to your caregiver about these problems. It is especially important to talk to your caregiver if a new problem develops soon after your last Pap test. In these cases, your caregiver may recommend more frequent screening and Pap tests.  The above recommendations are the same for women who have or have not gotten the vaccine for human papillomavirus (HPV).  If you had a hysterectomy for a problem that was not cancer or a condition that could lead to cancer, then you no longer need  Pap tests. Even if you no longer need a Pap test, a regular exam is a good idea to make sure no other problems are starting.  If you are between ages 60 and 3, and you have had normal Pap tests going back 10 years, you no longer need Pap tests. Even if you no longer need a Pap test, a regular exam is a good idea to make sure no other problems are starting.  If you have had past treatment for cervical cancer or a condition that could lead to cancer, you need Pap tests and screening for cancer for at least 20 years after your treatment.  If Pap tests have been discontinued, risk factors (such as a new sexual partner) need to be reassessed to determine if screening should be resumed.  The HPV test is an additional test that may be used for cervical cancer screening. The HPV test looks for the virus that can cause the cell changes on the cervix. The cells collected during  the Pap test can be tested for HPV. The HPV test could be used to screen women aged 83 years and older, and should be used in women of any age who have unclear Pap test results. After the age of 11, women should have HPV testing at the same frequency as a Pap test.  Colorectal cancer can be detected and often prevented. Most routine colorectal cancer screening begins at the age of 65 and continues through age 72. However, your caregiver may recommend screening at an earlier age if you have risk factors for colon cancer. On a yearly basis, your caregiver may provide home test kits to check for hidden blood in the stool. Use of a small camera at the end of a tube, to directly examine the colon (sigmoidoscopy or colonoscopy), can detect the earliest forms of colorectal cancer. Talk to your caregiver about this at age 66, when routine screening begins. Direct examination of the colon should be repeated every 5 to 10 years through age 60, unless early forms of pre-cancerous polyps or small growths are found.  Hepatitis C blood testing is  recommended for all people born from [birth year] through 1965 and any individual with known risks for hepatitis C.  Practice safe sex. Use condoms and avoid high-risk sexual practices to reduce the spread of sexually transmitted infections (STIs). STIs include gonorrhea, chlamydia, syphilis, trichomonas, herpes, HPV, and human immunodeficiency virus (HIV). Herpes, HIV, and HPV are viral illnesses that have no cure. They can result in disability, cancer, and death. Sexually active women aged 64 and younger should be checked for chlamydia. Older women with new or multiple partners should also be tested for chlamydia. Testing for other STIs is recommended if you are sexually active and at increased risk.  Osteoporosis is a disease in which the bones lose minerals and strength with aging. This can result in serious bone fractures. The risk of osteoporosis can be identified using a bone density scan. Women ages 44 and over and women at risk for fractures or osteoporosis should discuss screening with their caregivers. Ask your caregiver whether you should take a calcium supplement or vitamin D to reduce the rate of osteoporosis.  Menopause can be associated with physical symptoms and risks. Hormone replacement therapy is available to decrease symptoms and risks. You should talk to your caregiver about whether hormone replacement therapy is right for you.  Use sunscreen with sun protection factor (SPF) of 30 or more. Apply sunscreen liberally and repeatedly throughout the day. You should seek shade when your shadow is shorter than you. Protect yourself by wearing long sleeves, pants, a wide-brimmed hat, and sunglasses year round, whenever you are outdoors.  Once a month, do a whole body skin exam, using a mirror to look at the skin on your back. Notify your caregiver of new moles, moles that have irregular borders, moles that are larger than a pencil eraser, or moles that have changed in shape or color.  Stay current  with required immunizations.  Influenza. You need a dose every fall (or winter). The composition of the flu vaccine changes each year, so being vaccinated once is not enough.  Pneumococcal polysaccharide. You need 1 to 2 doses if you smoke cigarettes or if you have certain chronic medical conditions. You need 1 dose at age 45 (or older) if you have never been vaccinated.  Tetanus, diphtheria, pertussis (Tdap, Td). Get 1 dose of Tdap vaccine if you are younger than age 98, are over 27 and have contact  with an infant, are a Research scientist (physical sciences), are pregnant, or simply want to be protected from whooping cough. After that, you need a Td booster dose every 10 years. Consult your caregiver if you have not had at least 3 tetanus and diphtheria-containing shots sometime in your life or have a deep or dirty wound.  HPV. You need this vaccine if you are a woman age 75 or younger. The vaccine is given in 3 doses over 6 months.  Measles, mumps, rubella (MMR). You need at least 1 dose of MMR if you were born in 1957 or later. You may also need a second dose.  Meningococcal. If you are age 79 to 49 and a first-year college student living in a residence hall, or have one of several medical conditions, you need to get vaccinated against meningococcal disease. You may also need additional booster doses.  Zoster (shingles). If you are age 44 or older, you should get this vaccine.  Varicella (chickenpox). If you have never had chickenpox or you were vaccinated but received only 1 dose, talk to your caregiver to find out if you need this vaccine.  Hepatitis A. You need this vaccine if you have a specific risk factor for hepatitis A virus infection or you simply wish to be protected from this disease. The vaccine is usually given as 2 doses, 6 to 18 months apart.  Hepatitis B. You need this vaccine if you have a specific risk factor for hepatitis B virus infection or you simply wish to be protected from this disease.  The vaccine is given in 3 doses, usually over 6 months. Preventive Services / Frequency Ages 71 to 37  Blood pressure check.** / Every 1 to 2 years.  Lipid and cholesterol check.** / Every 5 years beginning at age 109.  Clinical breast exam.** / Every 3 years for women in their 68s and 30s.  Pap test.** / Every 2 years from ages 24 through 34. Every 3 years starting at age 53 through age 74 or 65 with a history of 3 consecutive normal Pap tests.  HPV screening.** / Every 3 years from ages 51 through ages 24 to 46 with a history of 3 consecutive normal Pap tests.  Hepatitis C blood test.** / For any individual with known risks for hepatitis C.  Skin self-exam. / Monthly.  Influenza immunization.** / Every year.  Pneumococcal polysaccharide immunization.** / 1 to 2 doses if you smoke cigarettes or if you have certain chronic medical conditions.  Tetanus, diphtheria, pertussis (Tdap, Td) immunization. / A one-time dose of Tdap vaccine. After that, you need a Td booster dose every 10 years.  HPV immunization. / 3 doses over 6 months, if you are 58 and younger.  Measles, mumps, rubella (MMR) immunization. / You need at least 1 dose of MMR if you were born in 1957 or later. You may also need a second dose.  Meningococcal immunization. / 1 dose if you are age 32 to 65 and a first-year college student living in a residence hall, or have one of several medical conditions, you need to get vaccinated against meningococcal disease. You may also need additional booster doses.  Varicella immunization.** / Consult your caregiver.  Hepatitis A immunization.** / Consult your caregiver. 2 doses, 6 to 18 months apart.  Hepatitis B immunization.** / Consult your caregiver. 3 doses usually over 6 months. Ages 46 to 45  Blood pressure check.** / Every 1 to 2 years.  Lipid and cholesterol check.** / Every 5  years beginning at age 7.  Clinical breast exam.** / Every year after age  67.  Mammogram.** / Every year beginning at age 39 and continuing for as long as you are in good health. Consult with your caregiver.  Pap test.** / Every 3 years starting at age 38 through age 1 or 73 with a history of 3 consecutive normal Pap tests.  HPV screening.** / Every 3 years from ages 51 through ages 73 to 28 with a history of 3 consecutive normal Pap tests.  Fecal occult blood test (FOBT) of stool. / Every year beginning at age 36 and continuing until age 95. You may not need to do this test if you get a colonoscopy every 10 years.  Flexible sigmoidoscopy or colonoscopy.** / Every 5 years for a flexible sigmoidoscopy or every 10 years for a colonoscopy beginning at age 3 and continuing until age 15.  Hepatitis C blood test.** / For all people born from [birth year] through 1964 and any individual with known risks for hepatitis C.  Skin self-exam. / Monthly.  Influenza immunization.** / Every year.  Pneumococcal polysaccharide immunization.** / 1 to 2 doses if you smoke cigarettes or if you have certain chronic medical conditions.  Tetanus, diphtheria, pertussis (Tdap, Td) immunization.** / A one-time dose of Tdap vaccine. After that, you need a Td booster dose every 10 years.  Measles, mumps, rubella (MMR) immunization. / You need at least 1 dose of MMR if you were born in 1957 or later. You may also need a second dose.  Varicella immunization.** / Consult your caregiver.  Meningococcal immunization.** / Consult your caregiver.  Hepatitis A immunization.** / Consult your caregiver. 2 doses, 6 to 18 months apart.  Hepatitis B immunization.** / Consult your caregiver. 3 doses, usually over 6 months. Ages 69 and over  Blood pressure check.** / Every 1 to 2 years.  Lipid and cholesterol check.** / Every 5 years beginning at age 67.  Clinical breast exam.** / Every year after age 42.  Mammogram.** / Every year beginning at age 71 and continuing for as long as you are in good  health. Consult with your caregiver.  Pap test.** / Every 3 years starting at age 50 through age 43 or 27 with a 3 consecutive normal Pap tests. Testing can be stopped between 65 and 70 with 3 consecutive normal Pap tests and no abnormal Pap or HPV tests in the past 10 years.  HPV screening.** / Every 3 years from ages 62 through ages 61 or 34 with a history of 3 consecutive normal Pap tests. Testing can be stopped between 65 and 70 with 3 consecutive normal Pap tests and no abnormal Pap or HPV tests in the past 10 years.  Fecal occult blood test (FOBT) of stool. / Every year beginning at age 39 and continuing until age 54. You may not need to do this test if you get a colonoscopy every 10 years.  Flexible sigmoidoscopy or colonoscopy.** / Every 5 years for a flexible sigmoidoscopy or every 10 years for a colonoscopy beginning at age 65 and continuing until age 54.  Hepatitis C blood test.** / For all people born from [birth year] through 1964 and any individual with known risks for hepatitis C.  Osteoporosis screening.** / A one-time screening for women ages 52 and over and women at risk for fractures or osteoporosis.  Skin self-exam. / Monthly.  Influenza immunization.** / Every year.  Pneumococcal polysaccharide immunization.** / 1 dose at age 71 (or older)  if you have never been vaccinated.  Tetanus, diphtheria, pertussis (Tdap, Td) immunization. / A one-time dose of Tdap vaccine if you are over 65 and have contact with an infant, are a Research scientist (physical sciences), or simply want to be protected from whooping cough. After that, you need a Td booster dose every 10 years.  Varicella immunization.** / Consult your caregiver.  Meningococcal immunization.** / Consult your caregiver.  Hepatitis A immunization.** / Consult your caregiver. 2 doses, 6 to 18 months apart.  Hepatitis B immunization.** / Check with your caregiver. 3 doses, usually over 6 months. ** Family history and personal history of risk and  conditions may change your caregiver's recommendations. Document Released: 08/16/2001 Document Revised: 09/12/2011 Document Reviewed: 11/15/2010 [facility] Patient Information 2013 [place], [place].

## 2012-08-22 NOTE — Assessment & Plan Note (Signed)
Normal exam will follow yearly isolated finding

## 2012-08-22 NOTE — Progress Notes (Signed)
Chief Complaint  Patient presents with  . Annual Exam    HPI: Patient comes in today for Preventive Health Care visit   Since her last visit she has done well with no major changes in her health. She did see Dr. Excell Seltzer endocrinologist who felt that her thyroid abnormalities could be followed and eventually to become abnormal and need intervention. But on repeat was normal  Periods  Ok now .  Uses condoms  Seatbelts in the car dentist on a regular basis exercises 5 times a week works full-time household to [employer]  No tobacco  ROS:  GEN/ HEENT: No fever, significant weight changes sweats headaches vision problems hearing changes, CV/ PULM; No chest pain shortness of breath cough, syncope,edema  change in exercise tolerance. GI /GU: No adominal pain, vomiting, change in bowel habits. No blood in the stool. No significant GU symptoms. SKIN/HEME: ,no acute skin rashes suspicious lesions or bleeding. No lymphadenopathy, nodules, masses.  NEURO/ PSYCH:  No neurologic signs such as weakness numbness. No depression anxiety. IMM/ Allergy: No unusual infections.  Allergy .   REST of 12 system review negative except as per HPI  Has had in the right ring and little finger at times wonders if it's from using the computer much when someone shook her hand it was tender and difficult to pick things up but no weakness or numbness. No elbow problem.some pain   Past Medical History  Diagnosis Date  . Nasal polyps   . Mood disorder   . Problems related to lack of adequate sleep   . Dry skin   . Recurrent HSV (herpes simplex virus)     Family History  Problem Relation Age of Onset  . Diabetes Other   . Thyroid disease Other     Sister  . Other Father     Parathyroid    History   Social History  . Marital Status: Married    Spouse Name: N/A    Number of Children: N/A  . Years of Education: N/A   Social History Main Topics  . Smoking status: Never Smoker   . Smokeless tobacco:  Never Used  . Alcohol Use: Yes     Comment: occ.  . Drug Use: None  . Sexually Active: None   Other Topics Concern  . 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    No ets.    Work out 5 x per week.    6-7 hours                    Outpatient Encounter Prescriptions as of 08/22/2012  Medication Sig Dispense Refill  . hydrocortisone 2.5 % cream Apply topically 3 (three) times daily.  30 g  0  . valACYclovir (VALTREX) 500 MG tablet TAKE 1 TABLET (500 MG TOTAL) BY MOUTH 2 (TWO) TIMES DAILY.  60 tablet  0   No facility-administered encounter medications on file as of 08/22/2012.    EXAM:  BP 96/64  Pulse 86  Temp(Src) 98.3 F (36.8 C) (Oral)  Ht 5' 2.5" (1.588 m)  Wt 115 lb (52.164 kg)  BMI 20.69 kg/m2  SpO2 98%  LMP 08/14/2012  Body mass index is 20.69 kg/(m^2).  Physical Exam: Vital signs reviewed JXB:JYNW is a well-developed well-nourished alert cooperative   female who appears her stated  age in no acute distress.  HEENT: normocephalic atraumatic , Eyes: PERRL EOM's full, conjunctiva clear, Nares: paten,t no deformity discharge or tenderness., Ears: no deformity EAC's clear TMs with normal landmarks. Mouth: clear OP, no lesions, edema.  Moist mucous membranes. Dentition in adequate repair. NECK: supple without masses, or bruits.  Full thyroid no nodules or tenderness CHEST/PULM:  Clear to auscultation and percussion breath sounds equal no wheeze , rales or rhonchi. No chest wall deformities or tenderness. Breast: normal by inspection . No dimpling, discharge, masses, tenderness or discharge . CV: PMI is nondisplaced, S1 S2 no gallops, murmurs, rubs. Peripheral pulses are full without delay.No JVD .  ABDOMEN: Bowel sounds normal nontender  No guard or rebound, no hepato splenomegal no CVA tenderness.  No hernia. Extremtities:  No clubbing cyanosis or edema, no acute joint  swelling or redness no focal atrophy NEURO:  Oriented x3, cranial nerves 3-12 appear to be intact, no obvious focal weakness,gait within normal limits no abnormal reflexes or asymmetrical SKIN: No acute rashes normal turgor, color, no bruising or petechiae. Moles no unulal ones  PSYCH: Oriented, good eye contact, no obvious depression anxiety, cognition and judgment appear normal. LN: no cervical axillary inguinal adenopathy  Lab Results  Component Value Date   WBC 3.5* 08/14/2012   HGB 13.8 08/14/2012   HCT 40.7 08/14/2012   PLT 212.0 08/14/2012   GLUCOSE 89 08/14/2012   CHOL 136 08/14/2012   TRIG 52.0 08/14/2012   HDL 53.60 08/14/2012   LDLCALC 72 08/14/2012   ALT 17 08/14/2012   AST 25 08/14/2012   NA 138 08/14/2012   K 3.8 08/14/2012   CL 103 08/14/2012   CREATININE 0.8 08/14/2012   BUN 17 08/14/2012   CO2 29 08/14/2012   TSH 3.43 08/14/2012    ASSESSMENT AND PLAN:  Discussed the following assessment and plan:  Visit for preventive health examination  Autoimmune thyroiditis - follow yearly   LEUKOPENIA, MILD - nl exam follow yearly  Patient Care Team: Philip Aspen, DO as PCP - General (Obstetrics and Gynecology) Reather Littler, MD as Attending Physician (Endocrinology) Patient Instructions  Continue lifestyle intervention healthy eating and exercise .  Can get updated tdap when you wish. Check year ly  With labs.   Preventive Care for Adults, Female A healthy lifestyle and preventive care can promote health and wellness. Preventive health guidelines for women include the following key practices.  A routine yearly physical is a good way to check with your caregiver about your health and preventive screening. It is a chance to share any concerns and updates on your health, and to receive a thorough exam.  Visit your dentist for a routine exam and preventive care every 6 months. Brush your teeth twice a day and floss once a day. Good oral hygiene prevents tooth decay and gum  disease.  The frequency of eye exams is based on your age, health, family medical history, use of contact lenses, and other factors. Follow your caregiver's recommendations for frequency of eye exams.  Eat a healthy diet. Foods like vegetables, fruits, whole grains, low-fat dairy products, and lean protein foods contain the nutrients you need without too many calories. Decrease your intake of foods high in solid fats, added sugars, and salt. Eat the right amount of calories for you.Get information about a proper diet from your caregiver, if necessary.  Regular physical exercise is one of the most important things you can do for your health. Most adults should get at least 150  minutes of moderate-intensity exercise (any activity that increases your heart rate and causes you to sweat) each week. In addition, most adults need muscle-strengthening exercises on 2 or more days a week.  Maintain a healthy weight. The body mass index (BMI) is a screening tool to identify possible weight problems. It provides an estimate of body fat based on height and weight. Your caregiver can help determine your BMI, and can help you achieve or maintain a healthy weight.For adults 20 years and older:  A BMI below 18.5 is considered underweight.  A BMI of 18.5 to 24.9 is normal.  A BMI of 25 to 29.9 is considered overweight.  A BMI of 30 and above is considered obese.  Maintain normal blood lipids and cholesterol levels by exercising and minimizing your intake of saturated fat. Eat a balanced diet with plenty of fruit and vegetables. Blood tests for lipids and cholesterol should begin at age 66 and be repeated every 5 years. If your lipid or cholesterol levels are high, you are over 50, or you are at high risk for heart disease, you may need your cholesterol levels checked more frequently.Ongoing high lipid and cholesterol levels should be treated with medicines if diet and exercise are not effective.  If you smoke,  find out from your caregiver how to quit. If you do not use tobacco, do not start.  If you are pregnant, do not drink alcohol. If you are breastfeeding, be very cautious about drinking alcohol. If you are not pregnant and choose to drink alcohol, do not exceed 1 drink per day. One drink is considered to be 12 ounces (355 mL) of beer, 5 ounces (148 mL) of wine, or 1.5 ounces (44 mL) of liquor.  Avoid use of street drugs. Do not share needles with anyone. Ask for help if you need support or instructions about stopping the use of drugs.  High blood pressure causes heart disease and increases the risk of stroke. Your blood pressure should be checked at least every 1 to 2 years. Ongoing high blood pressure should be treated with medicines if weight loss and exercise are not effective.  If you are 22 to 41 years old, ask your caregiver if you should take aspirin to prevent strokes.  Diabetes screening involves taking a blood sample to check your fasting blood sugar level. This should be done once every 3 years, after age 69, if you are within normal weight and without risk factors for diabetes. Testing should be considered at a younger age or be carried out more frequently if you are overweight and have at least 1 risk factor for diabetes.  Breast cancer screening is essential preventive care for women. You should practice "breast self-awareness." This means understanding the normal appearance and feel of your breasts and may include breast self-examination. Any changes detected, no matter how small, should be reported to a caregiver. Women in their 24s and 30s should have a clinical breast exam (CBE) by a caregiver as part of a regular health exam every 1 to 3 years. After age 69, women should have a CBE every year. Starting at age 37, women should consider having a mammography (breast X-ray test) every year. Women who have a family history of breast cancer should talk to their caregiver about genetic  screening. Women at a high risk of breast cancer should talk to their caregivers about having magnetic resonance imaging (MRI) and a mammography every year.  The Pap test is a screening test for cervical  cancer. A Pap test can show cell changes on the cervix that might become cervical cancer if left untreated. A Pap test is a procedure in which cells are obtained and examined from the lower end of the uterus (cervix).  Women should have a Pap test starting at age 42.  Between ages 56 and 57, Pap tests should be repeated every 2 years.  Beginning at age 64, you should have a Pap test every 3 years as long as the past 3 Pap tests have been normal.  Some women have medical problems that increase the chance of getting cervical cancer. Talk to your caregiver about these problems. It is especially important to talk to your caregiver if a new problem develops soon after your last Pap test. In these cases, your caregiver may recommend more frequent screening and Pap tests.  The above recommendations are the same for women who have or have not gotten the vaccine for human papillomavirus (HPV).  If you had a hysterectomy for a problem that was not cancer or a condition that could lead to cancer, then you no longer need Pap tests. Even if you no longer need a Pap test, a regular exam is a good idea to make sure no other problems are starting.  If you are between ages 41 and 55, and you have had normal Pap tests going back 10 years, you no longer need Pap tests. Even if you no longer need a Pap test, a regular exam is a good idea to make sure no other problems are starting.  If you have had past treatment for cervical cancer or a condition that could lead to cancer, you need Pap tests and screening for cancer for at least 20 years after your treatment.  If Pap tests have been discontinued, risk factors (such as a new sexual partner) need to be reassessed to determine if screening should be resumed.  The  HPV test is an additional test that may be used for cervical cancer screening. The HPV test looks for the virus that can cause the cell changes on the cervix. The cells collected during the Pap test can be tested for HPV. The HPV test could be used to screen women aged 37 years and older, and should be used in women of any age who have unclear Pap test results. After the age of 7, women should have HPV testing at the same frequency as a Pap test.  Colorectal cancer can be detected and often prevented. Most routine colorectal cancer screening begins at the age of 37 and continues through age 50. However, your caregiver may recommend screening at an earlier age if you have risk factors for colon cancer. On a yearly basis, your caregiver may provide home test kits to check for hidden blood in the stool. Use of a small camera at the end of a tube, to directly examine the colon (sigmoidoscopy or colonoscopy), can detect the earliest forms of colorectal cancer. Talk to your caregiver about this at age 43, when routine screening begins. Direct examination of the colon should be repeated every 5 to 10 years through age 59, unless early forms of pre-cancerous polyps or small growths are found.  Hepatitis C blood testing is recommended for all people born from [birth year] through 1965 and any individual with known risks for hepatitis C.  Practice safe sex. Use condoms and avoid high-risk sexual practices to reduce the spread of sexually transmitted infections (STIs). STIs include gonorrhea, chlamydia, syphilis, trichomonas, herpes, HPV,  and human immunodeficiency virus (HIV). Herpes, HIV, and HPV are viral illnesses that have no cure. They can result in disability, cancer, and death. Sexually active women aged 78 and younger should be checked for chlamydia. Older women with new or multiple partners should also be tested for chlamydia. Testing for other STIs is recommended if you are sexually active and at increased  risk.  Osteoporosis is a disease in which the bones lose minerals and strength with aging. This can result in serious bone fractures. The risk of osteoporosis can be identified using a bone density scan. Women ages 79 and over and women at risk for fractures or osteoporosis should discuss screening with their caregivers. Ask your caregiver whether you should take a calcium supplement or vitamin D to reduce the rate of osteoporosis.  Menopause can be associated with physical symptoms and risks. Hormone replacement therapy is available to decrease symptoms and risks. You should talk to your caregiver about whether hormone replacement therapy is right for you.  Use sunscreen with sun protection factor (SPF) of 30 or more. Apply sunscreen liberally and repeatedly throughout the day. You should seek shade when your shadow is shorter than you. Protect yourself by wearing long sleeves, pants, a wide-brimmed hat, and sunglasses year round, whenever you are outdoors.  Once a month, do a whole body skin exam, using a mirror to look at the skin on your back. Notify your caregiver of new moles, moles that have irregular borders, moles that are larger than a pencil eraser, or moles that have changed in shape or color.  Stay current with required immunizations.  Influenza. You need a dose every fall (or winter). The composition of the flu vaccine changes each year, so being vaccinated once is not enough.  Pneumococcal polysaccharide. You need 1 to 2 doses if you smoke cigarettes or if you have certain chronic medical conditions. You need 1 dose at age 78 (or older) if you have never been vaccinated.  Tetanus, diphtheria, pertussis (Tdap, Td). Get 1 dose of Tdap vaccine if you are younger than age 93, are over 82 and have contact with an infant, are a Research scientist (physical sciences), are pregnant, or simply want to be protected from whooping cough. After that, you need a Td booster dose every 10 years. Consult your caregiver if  you have not had at least 3 tetanus and diphtheria-containing shots sometime in your life or have a deep or dirty wound.  HPV. You need this vaccine if you are a woman age 11 or younger. The vaccine is given in 3 doses over 6 months.  Measles, mumps, rubella (MMR). You need at least 1 dose of MMR if you were born in 1957 or later. You may also need a second dose.  Meningococcal. If you are age 10 to 21 and a first-year college student living in a residence hall, or have one of several medical conditions, you need to get vaccinated against meningococcal disease. You may also need additional booster doses.  Zoster (shingles). If you are age 60 or older, you should get this vaccine.  Varicella (chickenpox). If you have never had chickenpox or you were vaccinated but received only 1 dose, talk to your caregiver to find out if you need this vaccine.  Hepatitis A. You need this vaccine if you have a specific risk factor for hepatitis A virus infection or you simply wish to be protected from this disease. The vaccine is usually given as 2 doses, 6 to 18 months apart.  Hepatitis B. You need this vaccine if you have a specific risk factor for hepatitis B virus infection or you simply wish to be protected from this disease. The vaccine is given in 3 doses, usually over 6 months. Preventive Services / Frequency Ages 5 to 57  Blood pressure check.** / Every 1 to 2 years.  Lipid and cholesterol check.** / Every 5 years beginning at age 76.  Clinical breast exam.** / Every 3 years for women in their 19s and 30s.  Pap test.** / Every 2 years from ages 49 through 18. Every 3 years starting at age 75 through age 69 or 57 with a history of 3 consecutive normal Pap tests.  HPV screening.** / Every 3 years from ages 47 through ages 22 to 40 with a history of 3 consecutive normal Pap tests.  Hepatitis C blood test.** / For any individual with known risks for hepatitis C.  Skin self-exam. /  Monthly.  Influenza immunization.** / Every year.  Pneumococcal polysaccharide immunization.** / 1 to 2 doses if you smoke cigarettes or if you have certain chronic medical conditions.  Tetanus, diphtheria, pertussis (Tdap, Td) immunization. / A one-time dose of Tdap vaccine. After that, you need a Td booster dose every 10 years.  HPV immunization. / 3 doses over 6 months, if you are 33 and younger.  Measles, mumps, rubella (MMR) immunization. / You need at least 1 dose of MMR if you were born in 1957 or later. You may also need a second dose.  Meningococcal immunization. / 1 dose if you are age 53 to 75 and a first-year college student living in a residence hall, or have one of several medical conditions, you need to get vaccinated against meningococcal disease. You may also need additional booster doses.  Varicella immunization.** / Consult your caregiver.  Hepatitis A immunization.** / Consult your caregiver. 2 doses, 6 to 18 months apart.  Hepatitis B immunization.** / Consult your caregiver. 3 doses usually over 6 months. Ages 16 to 65  Blood pressure check.** / Every 1 to 2 years.  Lipid and cholesterol check.** / Every 5 years beginning at age 9.  Clinical breast exam.** / Every year after age 36.  Mammogram.** / Every year beginning at age 37 and continuing for as long as you are in good health. Consult with your caregiver.  Pap test.** / Every 3 years starting at age 17 through age 62 or 60 with a history of 3 consecutive normal Pap tests.  HPV screening.** / Every 3 years from ages 18 through ages 51 to 55 with a history of 3 consecutive normal Pap tests.  Fecal occult blood test (FOBT) of stool. / Every year beginning at age 72 and continuing until age 8. You may not need to do this test if you get a colonoscopy every 10 years.  Flexible sigmoidoscopy or colonoscopy.** / Every 5 years for a flexible sigmoidoscopy or every 10 years for a colonoscopy beginning at age 52  and continuing until age 97.  Hepatitis C blood test.** / For all people born from [birth year] through 1964 and any individual with known risks for hepatitis C.  Skin self-exam. / Monthly.  Influenza immunization.** / Every year.  Pneumococcal polysaccharide immunization.** / 1 to 2 doses if you smoke cigarettes or if you have certain chronic medical conditions.  Tetanus, diphtheria, pertussis (Tdap, Td) immunization.** / A one-time dose of Tdap vaccine. After that, you need a Td booster dose every 10 years.  Measles, mumps,  rubella (MMR) immunization. / You need at least 1 dose of MMR if you were born in 1957 or later. You may also need a second dose.  Varicella immunization.** / Consult your caregiver.  Meningococcal immunization.** / Consult your caregiver.  Hepatitis A immunization.** / Consult your caregiver. 2 doses, 6 to 18 months apart.  Hepatitis B immunization.** / Consult your caregiver. 3 doses, usually over 6 months. Ages 39 and over  Blood pressure check.** / Every 1 to 2 years.  Lipid and cholesterol check.** / Every 5 years beginning at age 59.  Clinical breast exam.** / Every year after age 75.  Mammogram.** / Every year beginning at age 3 and continuing for as long as you are in good health. Consult with your caregiver.  Pap test.** / Every 3 years starting at age 2 through age 37 or 70 with a 3 consecutive normal Pap tests. Testing can be stopped between 65 and 70 with 3 consecutive normal Pap tests and no abnormal Pap or HPV tests in the past 10 years.  HPV screening.** / Every 3 years from ages 14 through ages 31 or 48 with a history of 3 consecutive normal Pap tests. Testing can be stopped between 65 and 70 with 3 consecutive normal Pap tests and no abnormal Pap or HPV tests in the past 10 years.  Fecal occult blood test (FOBT) of stool. / Every year beginning at age 11 and continuing until age 97. You may not need to do this test if you get a colonoscopy every 10  years.  Flexible sigmoidoscopy or colonoscopy.** / Every 5 years for a flexible sigmoidoscopy or every 10 years for a colonoscopy beginning at age 25 and continuing until age 7.  Hepatitis C blood test.** / For all people born from [birth year] through 1964 and any individual with known risks for hepatitis C.  Osteoporosis screening.** / A one-time screening for women ages 96 and over and women at risk for fractures or osteoporosis.  Skin self-exam. / Monthly.  Influenza immunization.** / Every year.  Pneumococcal polysaccharide immunization.** / 1 dose at age 87 (or older) if you have never been vaccinated.  Tetanus, diphtheria, pertussis (Tdap, Td) immunization. / A one-time dose of Tdap vaccine if you are over 65 and have contact with an infant, are a Research scientist (physical sciences), or simply want to be protected from whooping cough. After that, you need a Td booster dose every 10 years.  Varicella immunization.** / Consult your caregiver.  Meningococcal immunization.** / Consult your caregiver.  Hepatitis A immunization.** / Consult your caregiver. 2 doses, 6 to 18 months apart.  Hepatitis B immunization.** / Check with your caregiver. 3 doses, usually over 6 months. ** Family history and personal history of risk and conditions may change your caregiver's recommendations. Document Released: 08/16/2001 Document Revised: 09/12/2011 Document Reviewed: 11/15/2010 [facility] Patient Information 2013 [place], [place].     Neta Mends. [name] M.D. Health Maintenance  Topic Date Due  . Influenza Vaccine  03/04/2012  . Tetanus/tdap  08/22/2014  . Pap Smear  07/05/2015   Health Maintenance Review  Declined  tdap today  Possibly UTD

## 2012-10-25 ENCOUNTER — Telehealth: Payer: Self-pay | Admitting: Internal Medicine

## 2012-10-25 ENCOUNTER — Telehealth: Payer: Self-pay | Admitting: Obstetrics and Gynecology

## 2012-10-25 NOTE — Telephone Encounter (Signed)
PT called and stated that the last time she was in, Dr. Fabian Sharp told her to have her Thyroid levels tested the next time she was feeling fatigue, ect. Please let me know what to bring her in for, thank you!

## 2012-10-25 NOTE — Telephone Encounter (Signed)
You stated in your last note that the patient should return for CPE.  She had this done in Feb.  What labs would you like to order?

## 2012-10-25 NOTE — Telephone Encounter (Signed)
tsh free t4   dx abnormal thyroid tests.

## 2012-10-26 ENCOUNTER — Other Ambulatory Visit: Payer: Self-pay | Admitting: Family Medicine

## 2012-10-26 DIAGNOSIS — R7989 Other specified abnormal findings of blood chemistry: Secondary | ICD-10-CM

## 2012-10-26 NOTE — Telephone Encounter (Signed)
I placed the order in the system.  Please schedule an appt with the pt.  Thanks!!

## 2012-10-29 ENCOUNTER — Other Ambulatory Visit: Payer: 59

## 2012-10-30 ENCOUNTER — Other Ambulatory Visit: Payer: 59

## 2012-11-19 ENCOUNTER — Other Ambulatory Visit: Payer: 59

## 2012-11-23 ENCOUNTER — Other Ambulatory Visit (INDEPENDENT_AMBULATORY_CARE_PROVIDER_SITE_OTHER): Payer: 59

## 2012-11-23 DIAGNOSIS — R7989 Other specified abnormal findings of blood chemistry: Secondary | ICD-10-CM

## 2012-11-23 DIAGNOSIS — R946 Abnormal results of thyroid function studies: Secondary | ICD-10-CM

## 2012-11-23 LAB — T4, FREE: Free T4: 0.85 ng/dL (ref 0.60–1.60)

## 2012-12-03 NOTE — Progress Notes (Signed)
Quick Note:  Called and spoke with pt and pt is aware. ______ 

## 2013-01-22 ENCOUNTER — Encounter: Payer: Self-pay | Admitting: Internal Medicine

## 2013-01-22 ENCOUNTER — Ambulatory Visit (INDEPENDENT_AMBULATORY_CARE_PROVIDER_SITE_OTHER): Payer: 59 | Admitting: Internal Medicine

## 2013-01-22 VITALS — BP 114/70 | HR 71 | Temp 98.0°F | Wt 117.0 lb

## 2013-01-22 DIAGNOSIS — N949 Unspecified condition associated with female genital organs and menstrual cycle: Secondary | ICD-10-CM

## 2013-01-22 DIAGNOSIS — E063 Autoimmune thyroiditis: Secondary | ICD-10-CM

## 2013-01-22 DIAGNOSIS — N910 Primary amenorrhea: Secondary | ICD-10-CM

## 2013-01-22 DIAGNOSIS — M549 Dorsalgia, unspecified: Secondary | ICD-10-CM

## 2013-01-22 DIAGNOSIS — R109 Unspecified abdominal pain: Secondary | ICD-10-CM

## 2013-01-22 DIAGNOSIS — N938 Other specified abnormal uterine and vaginal bleeding: Secondary | ICD-10-CM

## 2013-01-22 LAB — CBC WITH DIFFERENTIAL/PLATELET
Basophils Absolute: 0 10*3/uL (ref 0.0–0.1)
Basophils Relative: 0.3 % (ref 0.0–3.0)
Eosinophils Absolute: 0.1 10*3/uL (ref 0.0–0.7)
Hemoglobin: 14.5 g/dL (ref 12.0–15.0)
Lymphocytes Relative: 33.1 % (ref 12.0–46.0)
MCHC: 33.2 g/dL (ref 30.0–36.0)
MCV: 97.5 fl (ref 78.0–100.0)
Monocytes Absolute: 0.3 10*3/uL (ref 0.1–1.0)
Neutro Abs: 2.9 10*3/uL (ref 1.4–7.7)
RBC: 4.49 Mil/uL (ref 3.87–5.11)
RDW: 12.3 % (ref 11.5–14.6)

## 2013-01-22 LAB — POCT URINALYSIS DIPSTICK
Bilirubin, UA: NEGATIVE
Blood, UA: NEGATIVE
Glucose, UA: NEGATIVE
Leukocytes, UA: NEGATIVE
Nitrite, UA: NEGATIVE
Urobilinogen, UA: 0.2

## 2013-01-22 LAB — POCT URINE PREGNANCY: Preg Test, Ur: NEGATIVE

## 2013-01-22 NOTE — Progress Notes (Signed)
Chief Complaint  Patient presents with  . Headache    Period is 2 weeks late.  . Late Period  . Back Pain  . Abdominal Pain  . Night Sweats    HPI:  Patient comes in today for acute visit of above concerns. Had UPI see a little bit ago and is never late for periods but is 2 weeks late. Doesn't really feel pregnant but has some bloating right lower area of night sweats could be hot and headache in the past. Think she's worried about her symptoms Headache was 3-4 days ago no vision changes 2 weeks late.   Sweating   Also.  Neg ucg at home.    Doesn't feel pregnant.  No fever.    ROS: See pertinent positives and negatives per HPI.  Past Medical History  Diagnosis Date  . Nasal polyps   . Mood disorder   . Problems related to lack of adequate sleep   . Dry skin   . Recurrent HSV (herpes simplex virus)     Family History  Problem Relation Age of Onset  . Diabetes Other   . Thyroid disease Other     Sister  . Other Father     Parathyroid    History   Social History  . Marital Status: Married    Spouse Name: N/A    Number of Children: N/A  . Years of Education: N/A   Social History Main Topics  . Smoking status: Never Smoker   . Smokeless tobacco: Never Used  . Alcohol Use: Yes     Comment: occ.  . Drug Use: None  . Sexually Active: None   Other Topics Concern  . 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    No ets.    Work out 5 x per week.    6-7 hours                    Outpatient Encounter Prescriptions as of 01/22/2013  Medication Sig Dispense Refill  . valACYclovir (VALTREX) 500 MG tablet TAKE 1 TABLET (500 MG TOTAL) BY MOUTH 2 (TWO) TIMES DAILY.  60 tablet  0   No facility-administered encounter medications on file as of 01/22/2013.    EXAM:  BP 114/70  Pulse 71  Temp(Src) 98 F (36.7 C) (Oral)  Wt 117 lb (53.071 kg)  BMI 21.05 kg/m2  SpO2 98%   LMP 12/11/2012  Body mass index is 21.05 kg/(m^2).  GENERAL: vitals reviewed and listed above, alert, oriented, appears well hydrated and in no acute distress  HEENT: atraumatic, conjunctiva  clear, no obvious abnormalities on inspection of external nose and ears OP : no lesion edema or exudate  NECK: no obvious masses on inspection palpation thyroid palpable no nodules LUNGS: clear to auscultation bilaterally, no wheezes, rales or rhonchi, good air movement CV: HRRR, no clubbing cyanosis or  peripheral edema nl cap refill  Abdomen soft without organomegaly guarding or rebound does point to right lower mid supra-as possibly feeling uncomfortable no flank pain MS: moves all extremities without noticeable focal  abnormality  PSYCH: pleasant and cooperative, no obvious depression or anxiety Lab Results  Component Value Date   TSH 2.42 11/23/2012    ASSESSMENT AND PLAN:  Discussed the following assessment and plan:  Delayed menses - Plan: POCT urine pregnancy, POC Urinalysis  Dipstick, HCG, Quant, Pregnancy, TSH, Prolactin, T4, free, CBC with Differential  Abdominal pain, unspecified site - Plan: POCT urine pregnancy, POC Urinalysis Dipstick, HCG, Quant, Pregnancy, TSH, Prolactin, T4, free, CBC with Differential  Back pain - Plan: POCT urine pregnancy, POC Urinalysis Dipstick, HCG, Quant, Pregnancy, TSH, Prolactin, T4, free, CBC with Differential  Autoimmune thyroiditis Hx of upic mild right lower abd  Perception abn like something moving  Get CBC can repeat thyroid test because she does have autoimmune disease at prolactin.   Negative test will be reassuring and we can observe..  -Patient advised to return or notify health care team  if symptoms worsen or persist or new concerns arise.  Patient Instructions  Will notify you  of labs when available. This could just be a delayed period  Checking blood preg test to r/o abnormal pregnancy.  Of  Bl;ood preg test is negative and other  labs  Wait and see how you do. If  Continued delayed or other concern fu .      Neta Mends. Estalene Bergey M.D.

## 2013-01-22 NOTE — Patient Instructions (Addendum)
Will notify you  of labs when available. This could just be a delayed period  Checking blood preg test to r/o abnormal pregnancy.  Of  Bl;ood preg test is negative and other labs  Wait and see how you do. If  Continued delayed or other concern fu .

## 2013-01-23 ENCOUNTER — Telehealth: Payer: Self-pay | Admitting: Internal Medicine

## 2013-01-23 LAB — TSH: TSH: 2.56 u[IU]/mL (ref 0.35–5.50)

## 2013-01-23 LAB — PROLACTIN: Prolactin: 8.5 ng/mL

## 2013-01-23 NOTE — Telephone Encounter (Signed)
Pt notified results are not ready.  Will call later with results.

## 2013-01-23 NOTE — Telephone Encounter (Signed)
Pt had labs on 7/22. She would like a call back with the results. Thanks.

## 2013-01-24 ENCOUNTER — Telehealth: Payer: Self-pay | Admitting: Internal Medicine

## 2013-01-24 NOTE — Telephone Encounter (Signed)
PT is calling to request her lab results from 01/22/13. Please assist.

## 2013-01-25 NOTE — Telephone Encounter (Signed)
Left detailed message on personally identified cell informing the pt of all.  See lab results.

## 2013-05-09 ENCOUNTER — Other Ambulatory Visit: Payer: Self-pay

## 2013-07-31 ENCOUNTER — Other Ambulatory Visit: Payer: Self-pay

## 2013-07-31 DIAGNOSIS — Z1231 Encounter for screening mammogram for malignant neoplasm of breast: Secondary | ICD-10-CM

## 2013-08-16 ENCOUNTER — Ambulatory Visit
Admission: RE | Admit: 2013-08-16 | Discharge: 2013-08-16 | Disposition: A | Discharge status: Home / Self Care | Payer: BC Managed Care – PPO | Source: Ambulatory Visit

## 2013-08-16 DIAGNOSIS — Z1231 Encounter for screening mammogram for malignant neoplasm of breast: Secondary | ICD-10-CM

## 2013-08-19 ENCOUNTER — Other Ambulatory Visit (INDEPENDENT_AMBULATORY_CARE_PROVIDER_SITE_OTHER): Payer: BC Managed Care – PPO

## 2013-08-19 DIAGNOSIS — Z0000 Encounter for general adult medical examination without abnormal findings: Secondary | ICD-10-CM

## 2013-08-19 LAB — BASIC METABOLIC PANEL
BUN: 14 mg/dL (ref 6–23)
CALCIUM: 8.9 mg/dL (ref 8.4–10.5)
CO2: 26 mEq/L (ref 19–32)
Chloride: 104 mEq/L (ref 96–112)
Creatinine, Ser: 0.8 mg/dL (ref 0.4–1.2)
GFR: 85.1 mL/min (ref >60.00)
GLUCOSE: 87 mg/dL (ref 70–99)
Potassium: 3.9 mEq/L (ref 3.5–5.1)
Sodium: 139 mEq/L (ref 135–145)

## 2013-08-19 LAB — HEPATIC FUNCTION PANEL
ALK PHOS: 45 U/L (ref 39–117)
ALT: 14 U/L (ref 0–35)
AST: 20 U/L (ref 0–37)
Albumin: 3.8 g/dL (ref 3.5–5.2)
BILIRUBIN TOTAL: 0.6 mg/dL (ref 0.3–1.2)
Bilirubin, Direct: 0 mg/dL (ref 0.0–0.3)
Total Protein: 6.4 g/dL (ref 6.0–8.3)

## 2013-08-19 LAB — CBC WITH DIFFERENTIAL/PLATELET
BASOS PCT: 0.4 % (ref 0.0–3.0)
Basophils Absolute: 0 10*3/uL (ref 0.0–0.1)
EOS PCT: 3.2 % (ref 0.0–5.0)
Eosinophils Absolute: 0.1 10*3/uL (ref 0.0–0.7)
HEMATOCRIT: 40.4 % (ref 36.0–46.0)
HEMOGLOBIN: 13.3 g/dL (ref 12.0–15.0)
LYMPHS ABS: 1.2 10*3/uL (ref 0.7–4.0)
Lymphocytes Relative: 28.9 % (ref 12.0–46.0)
MCHC: 32.9 g/dL (ref 30.0–36.0)
MCV: 96.6 fl (ref 78.0–100.0)
MONO ABS: 0.4 10*3/uL (ref 0.1–1.0)
Monocytes Relative: 9.3 % (ref 3.0–12.0)
NEUTROS ABS: 2.5 10*3/uL (ref 1.4–7.7)
Neutrophils Relative %: 58.2 % (ref 43.0–77.0)
Platelets: 226 10*3/uL (ref 150.0–400.0)
RBC: 4.19 Mil/uL (ref 3.87–5.11)
RDW: 12 % (ref 11.5–14.6)
WBC: 4.3 10*3/uL — AB (ref 4.5–10.5)

## 2013-08-19 LAB — LIPID PANEL
CHOL/HDL RATIO: 2
Cholesterol: 125 mg/dL (ref 0–200)
HDL: 60.4 mg/dL (ref >39.00)
LDL Cholesterol: 60 mg/dL (ref 0–99)
Triglycerides: 25 mg/dL (ref 0.0–149.0)
VLDL: 5 mg/dL (ref 0.0–40.0)

## 2013-08-19 LAB — TSH: TSH: 4.19 u[IU]/mL (ref 0.35–5.50)

## 2013-08-28 ENCOUNTER — Ambulatory Visit (INDEPENDENT_AMBULATORY_CARE_PROVIDER_SITE_OTHER): Payer: BC Managed Care – PPO | Admitting: Internal Medicine

## 2013-08-28 ENCOUNTER — Encounter: Payer: Self-pay | Admitting: Internal Medicine

## 2013-08-28 VITALS — BP 100/70 | HR 65 | Temp 98.2°F | Ht 62.75 in | Wt 121.0 lb

## 2013-08-28 DIAGNOSIS — N926 Irregular menstruation, unspecified: Secondary | ICD-10-CM

## 2013-08-28 DIAGNOSIS — E063 Autoimmune thyroiditis: Secondary | ICD-10-CM

## 2013-08-28 DIAGNOSIS — R635 Abnormal weight gain: Secondary | ICD-10-CM

## 2013-08-28 DIAGNOSIS — Z0000 Encounter for general adult medical examination without abnormal findings: Secondary | ICD-10-CM

## 2013-08-28 NOTE — Patient Instructions (Addendum)
Accurately track sleep  All oral intake  And exercise activity over time to help manage weight gain.  Check tsh free t3 and free t3 in 3-4 months . Consider optimizing the resistance training .   Menopausal cahnges can be associated with metabolism change .   Your exam is good today

## 2013-08-28 NOTE — Progress Notes (Signed)
Chief Complaint  Patient presents with  . Annual Exam    HPI: Patient comes in today for Preventive Health Care visit  No major changes in health however she is concerned about her weight gain over the last 6 months or so doesn't feel she can tolerate if she gains more weight is working out many hours aerobic and some workout. She eats quite healthy she always has. She has had continued irregular periods and an elevated FSH felt to be possible premature menopause. She's been placed on some type of herbals and compounded hormones progesterone and others. It has helped her sleep in the last few months. He wonders if this could be related to her weight gain or her thyroid. She's been working at a fairly inactive job for 5-6 years there is no dramatic change. Just had GYN check yearly check  Health Maintenance  Topic Date Due  . Influenza Vaccine  07/04/2014 (Originally 02/01/2013)  . Tetanus/tdap  08/22/2014 (Originally 04/17/1991)  . Pap Smear  07/05/2015   Health Maintenance Review    ROS:  GEN/ HEENT: No fever,  sweats headaches vision problems hearing changes, CV/ PULM; No chest pain shortness of breath cough, syncope,edema  change in exercise tolerance. GI /GU: No adominal pain, vomiting, change in bowel habits. No blood in the stool. No significant GU symptoms. SKIN/HEME: ,no acute skin rashes suspicious lesions or bleeding. No lymphadenopathy, nodules, masses.  NEURO/ PSYCH:  No neurologic signs such as weakness numbness. No depression anxiety. IMM/ Allergy: No unusual infections.  Allergy .   REST of 12 system review negative except as per HPI   Past Medical History  Diagnosis Date  . Nasal polyps   . Mood disorder   . Problems related to lack of adequate sleep   . Dry skin   . Recurrent HSV (herpes simplex virus)     Family History  Problem Relation Age of Onset  . Diabetes Other   . Thyroid disease Other     Sister  . Other Father     Parathyroid    History    Social History  . Marital Status: Married    Spouse Name: N/A    Number of Children: N/A  . Years of Education: N/A   Social History Main Topics  . Smoking status: Never Smoker   . Smokeless tobacco: Never Used  . Alcohol Use: Yes     Comment: occ.  . Drug Use: None  . Sexual Activity: None   Other Topics Concern  . None   Social History Narrative   Married with children   Going to school.[name]...job real estate   hh OF 4  [place].    No ets.    Work out 5 x per week.    6-7 hours                       Outpatient Encounter Prescriptions as of 08/28/2013  Medication Sig  . valACYclovir (VALTREX) 500 MG tablet TAKE 1 TABLET (500 MG TOTAL) BY MOUTH 2 (TWO) TIMES DAILY.    EXAM:  BP 100/70  Pulse 65  Temp(Src) 98.2 F (36.8 C) (Oral)  Ht 5' 2.75" (1.594 m)  Wt 121 lb (54.885 kg)  BMI 21.60 kg/m2  SpO2 98%  LMP 07/15/2013  Body mass index is 21.6 kg/(m^2).  Physical Exam: Vital signs reviewed YPP:JKDT is a well-developed well-nourished alert cooperative   female who appears her stated age  in no acute distress.  HEENT: normocephalic atraumatic , Eyes: PERRL EOM's full, conjunctiva clear, Nares: paten,t no deformity discharge or tenderness., Ears: no deformity EAC's clear TMs with normal landmarks. Mouth: clear OP, no lesions, edema.  Moist mucous membranes. Dentition in adequate repair. NECK: supple without masses, thyromegaly or bruits. CHEST/PULM:  Clear to auscultation and percussion breath sounds equal no wheeze , rales or rhonchi. No chest wall deformities or tenderness. CV: PMI is nondisplaced, S1 S2 no gallops, murmurs, rubs. Peripheral pulses are full without delay.No JVD .  ABDOMEN: Bowel sounds normal nontender  No guard or rebound, no hepato splenomegal no CVA tenderness.  No hernia. Extremtities:  No clubbing cyanosis or edema, no acute joint swelling or redness no focal atrophy NEURO:  Oriented x3, cranial nerves 3-12 appear to be  intact, no obvious focal weakness,gait within normal limits no abnormal reflexes or asymmetrical SKIN: No acute rashes normal turgor, color, no bruising or petechiae. PSYCH: Oriented, good eye contact, no obvious depression anxiety, cognition and judgment appear normal. LN: no cervical axillary inguinal adenopathy  Lab Results  Component Value Date   WBC 4.3* 08/19/2013   HGB 13.3 08/19/2013   HCT 40.4 08/19/2013   PLT 226.0 08/19/2013   GLUCOSE 87 08/19/2013   CHOL 125 08/19/2013   TRIG 25.0 08/19/2013   HDL 60.40 08/19/2013   LDLCALC 60 08/19/2013   ALT 14 08/19/2013   AST 20 08/19/2013   NA 139 08/19/2013   K 3.9 08/19/2013   CL 104 08/19/2013   CREATININE 0.8 08/19/2013   BUN 14 08/19/2013   CO2 26 08/19/2013   TSH 4.19 08/19/2013    ASSESSMENT AND PLAN:  Discussed the following assessment and plan:  Visit for preventive health examination  Autoimmune thyroiditis - TSH is in range we'll recheck in 3-4 months because of concern about weight gain but not the cause  Weight gain concern  Irregular menstrual cycle poss perimenopausal Exam is unremarkable and no obvious cause of the weight problem except possibly perimenopausal changes. Discussed this for quite a while. We'll keep a close tab on her thyroid functions the next 3-4 months. Followup if persistent progressive or other changes. Again think a supplement will be helpful for health unless she wants to take pain mv Suppose autoimmune process risk but fits no pattern at this time Patient Care Team: Burnis Medin, MD as PCP - General (Internal Medicine) Elayne Snare, MD as Attending Physician (Endocrinology) Patient Instructions  Accurately track sleep  All oral intake  And exercise activity over time to help manage weight gain.  Check tsh free t3 and free t3 in 3-4 months . Consider optimizing the resistance training .   Menopausal cahnges can be associated with metabolism change .   Your exam is good today    Mariann Laster K. [name]  M.D.   Wt Readings from Last 3 Encounters:  08/28/13 121 lb (54.885 kg)  01/22/13 117 lb (53.071 kg)  08/22/12 115 lb (52.164 kg)    Pre visit review using our clinic review tool, if applicable. No additional management support is needed unless otherwise documented below in the visit note.

## 2013-09-27 ENCOUNTER — Encounter: Payer: Self-pay | Admitting: Internal Medicine

## 2013-09-27 ENCOUNTER — Ambulatory Visit (INDEPENDENT_AMBULATORY_CARE_PROVIDER_SITE_OTHER): Payer: BC Managed Care – PPO | Admitting: Internal Medicine

## 2013-09-27 VITALS — BP 100/64 | Temp 97.7°F | Ht 62.75 in | Wt 120.0 lb

## 2013-09-27 DIAGNOSIS — L089 Local infection of the skin and subcutaneous tissue, unspecified: Secondary | ICD-10-CM | POA: Insufficient documentation

## 2013-09-27 DIAGNOSIS — S20169A Insect bite (nonvenomous) of breast, unspecified breast, initial encounter: Secondary | ICD-10-CM

## 2013-09-27 DIAGNOSIS — S2096XA Insect bite (nonvenomous) of unspecified parts of thorax, initial encounter: Principal | ICD-10-CM

## 2013-09-27 DIAGNOSIS — W57XXXA Bitten or stung by nonvenomous insect and other nonvenomous arthropods, initial encounter: Principal | ICD-10-CM

## 2013-09-27 MED ORDER — DOXYCYCLINE HYCLATE 100 MG PO CAPS: 100.0000 mg | ORAL_CAPSULE | Freq: Two times a day (BID) | ORAL | Status: DC

## 2013-09-27 NOTE — Patient Instructions (Signed)
This seems like reaction to the tick bit but since amount of imflammation will add antibiotic  To cover  Poss infection.   except neosporin .    Tick Bite Information Ticks are insects that attach themselves to the skin and draw blood for food. There are various types of ticks. Common types include wood ticks and deer ticks. Most ticks live in shrubs and grassy areas. Ticks can climb onto your body when you make contact with leaves or grass where the tick is waiting. The most common places on the body for ticks to attach themselves are the scalp, neck, armpits, waist, and groin. Most tick bites are harmless, but sometimes ticks carry germs that cause diseases. These germs can be spread to a person during the tick's feeding process. The chance of a disease spreading through a tick bite depends on:   The type of tick.  Time of year.   How long the tick is attached.   Geographic location.  HOW CAN YOU PREVENT TICK BITES? Take these steps to help prevent tick bites when you are outdoors:  Wear protective clothing. Long sleeves and long pants are best.   Wear white clothes so you can see ticks more easily.  Tuck your pant legs into your socks.   If walking on a trail, stay in the middle of the trail to avoid brushing against bushes.  Avoid walking through areas with long grass.  Put insect repellent on all exposed skin and along boot tops, pant legs, and sleeve cuffs.   Check clothing, hair, and skin repeatedly and before going inside.   Brush off any ticks that are not attached.  Take a shower or bath as soon as possible after being outdoors.  WHAT IS THE PROPER WAY TO REMOVE A TICK? Ticks should be removed as soon as possible to help prevent diseases caused by tick bites. 1. If latex gloves are available, put them on before trying to remove a tick.  2. Using fine-point tweezers, grasp the tick as close to the skin as possible. You may also use curved forceps or a tick  removal tool. Grasp the tick as close to its head as possible. Avoid grasping the tick on its body. 3. Pull gently with steady upward pressure until the tick lets go. Do not twist the tick or jerk it suddenly. This may break off the tick's head or mouth parts. 4. Do not squeeze or crush the tick's body. This could force disease-carrying fluids from the tick into your body.  5. After the tick is removed, wash the bite area and your hands with soap and water or other disinfectant such as alcohol. 6. Apply a small amount of antiseptic cream or ointment to the bite site.  7. Wash and disinfect any instruments that were used.  Do not try to remove a tick by applying a hot match, petroleum jelly, or fingernail polish to the tick. These methods do not work and may increase the chances of disease being spread from the tick bite.  WHEN SHOULD YOU SEEK MEDICAL CARE? Contact your health care provider if you are unable to remove a tick from your skin or if a part of the tick breaks off and is stuck in the skin.  After a tick bite, you need to be aware of signs and symptoms that could be related to diseases spread by ticks. Contact your health care provider if you develop any of the following in the days or weeks after the  tick bite:  Unexplained fever.  Rash. A circular rash that appears days or weeks after the tick bite may indicate the possibility of Lyme disease. The rash may resemble a target with a bull's-eye and may occur at a different part of your body than the tick bite.  Redness and swelling in the area of the tick bite.   Tender, swollen lymph glands.   Diarrhea.   Weight loss.   Cough.   Fatigue.   Muscle, joint, or bone pain.   Abdominal pain.   Headache.   Lethargy or a change in your level of consciousness.  Difficulty walking or moving your legs.   Numbness in the legs.   Paralysis.  Shortness of breath.   Confusion.   Repeated vomiting.  Document  Released: 06/17/2000 Document Revised: 04/10/2013 Document Reviewed: 11/28/2012 [facility] Patient Information 2014 [place].

## 2013-09-27 NOTE — Progress Notes (Signed)
Chief Complaint  Patient presents with  . Insect Bite    Has a picture    HPI: Patient comes in today for SDA for  new problem evaluation. Noticed on Sunday 5 days ago visiting [place] pulled a tick off her left lateral trunk. Question whether a piece was left in has been itching red irritated since that time size is getting bigger she has a picture of fat in the tick. She thinks it's a deer tic. Feels fine generally .  No other rashes history of same does have dry skin ROS: See pertinent positives and negatives per HPI.  Past Medical History  Diagnosis Date  . Nasal polyps   . Mood disorder   . Problems related to lack of adequate sleep   . Dry skin   . Recurrent HSV (herpes simplex virus)     Family History  Problem Relation Age of Onset  . Diabetes Other   . Thyroid disease Other     Sister  . Other Father     Parathyroid    History   Social History  . Marital Status: Married    Spouse Name: N/A    Number of Children: N/A  . Years of Education: N/A   Social History Main Topics  . Smoking status: Never Smoker   . Smokeless tobacco: Never Used  . Alcohol Use: Yes     Comment: occ.  . Drug Use: None  . Sexual Activity: None   Other Topics Concern  . None   Social History Narrative   Married with children   Going to school.[name]...job real estate   hh OF 4  [place].    No ets.    Work out 5 x per week.    6-7 hours              Outpatient Encounter Prescriptions as of 09/27/2013  Medication Sig  . valACYclovir (VALTREX) 500 MG tablet TAKE 1 TABLET (500 MG TOTAL) BY MOUTH 2 (TWO) TIMES DAILY.  [name] doxycycline (VIBRAMYCIN) 100 MG capsule Take 1 capsule (100 mg total) by mouth 2 (two) times daily.    EXAM:  BP 100/64  Temp(Src) 97.7 F (36.5 C) (Oral)  Ht 5' 2.75" (1.594 m)  Wt 120 lb (54.432 kg)  BMI 21.42 kg/m2  LMP 07/15/2013  Body mass index is 21.42 kg/(m^2).  GENERAL: vitals reviewed and listed above, alert, oriented,  appears well hydrated and in no acute distress HEENT: atraumatic, conjunctiva  clear, no obvious abnormalities on inspection of external nose and earsNECK: no obvious masses on inspection palpation  MS: moves all extremities without noticeable focal  abnormality PSYCH: pleasant and cooperative, no obvious depression or anxiety Left trunk with 2.5 cm very read. Distinct border round lesion with a central bump excoriation no obvious foreign body might be discharge. There is a faint feeding of the internal rash but this is not certain. No other rashes noted. ASSESSMENT AND PLAN:  Discussed the following assessment and plan:  Infected tick bite of thoracic region Tick bite possible infection doubt if this is Lyme although if the rash progresses it could look like an EM rash we'll cover for with antibiotic doxycycline 100 twice a day for 10 days for skin infection   discussed with patient this would cover treatment for Lyme disease anyway. It is possible there is a small metal foreign body left in the area but with treatment compresses and time this result. -Patient advised to  return or notify health care team  if symptoms worsen ,persist or new concerns arise. HO from up to date  And discussion Patient Instructions  This seems like reaction to the tick bit but since amount of imflammation will add antibiotic  To cover  Poss infection.   except neosporin .    Tick Bite Information Ticks are insects that attach themselves to the skin and draw blood for food. There are various types of ticks. Common types include wood ticks and deer ticks. Most ticks live in shrubs and grassy areas. Ticks can climb onto your body when you make contact with leaves or grass where the tick is waiting. The most common places on the body for ticks to attach themselves are the scalp, neck, armpits, waist, and groin. Most tick bites are harmless, but sometimes ticks carry germs that cause diseases. These germs can be spread to  a person during the tick's feeding process. The chance of a disease spreading through a tick bite depends on:   The type of tick.  Time of year.   How long the tick is attached.   Geographic location.  HOW CAN YOU PREVENT TICK BITES? Take these steps to help prevent tick bites when you are outdoors:  Wear protective clothing. Long sleeves and long pants are best.   Wear white clothes so you can see ticks more easily.  Tuck your pant legs into your socks.   If walking on a trail, stay in the middle of the trail to avoid brushing against bushes.  Avoid walking through areas with long grass.  Put insect repellent on all exposed skin and along boot tops, pant legs, and sleeve cuffs.   Check clothing, hair, and skin repeatedly and before going inside.   Brush off any ticks that are not attached.  Take a shower or bath as soon as possible after being outdoors.  WHAT IS THE PROPER WAY TO REMOVE A TICK? Ticks should be removed as soon as possible to help prevent diseases caused by tick bites. 1. If latex gloves are available, put them on before trying to remove a tick.  2. Using fine-point tweezers, grasp the tick as close to the skin as possible. You may also use curved forceps or a tick removal tool. Grasp the tick as close to its head as possible. Avoid grasping the tick on its body. 3. Pull gently with steady upward pressure until the tick lets go. Do not twist the tick or jerk it suddenly. This may break off the tick's head or mouth parts. 4. Do not squeeze or crush the tick's body. This could force disease-carrying fluids from the tick into your body.  5. After the tick is removed, wash the bite area and your hands with soap and water or other disinfectant such as alcohol. 6. Apply a small amount of antiseptic cream or ointment to the bite site.  7. Wash and disinfect any instruments that were used.  Do not try to remove a tick by applying a hot match, petroleum  jelly, or fingernail polish to the tick. These methods do not work and may increase the chances of disease being spread from the tick bite.  WHEN SHOULD YOU SEEK MEDICAL CARE? Contact your health care provider if you are unable to remove a tick from your skin or if a part of the tick breaks off and is stuck in the skin.  After a tick bite, you need to be aware of signs and symptoms that  could be related to diseases spread by ticks. Contact your health care provider if you develop any of the following in the days or weeks after the tick bite:  Unexplained fever.  Rash. A circular rash that appears days or weeks after the tick bite may indicate the possibility of Lyme disease. The rash may resemble a target with a bull's-eye and may occur at a different part of your body than the tick bite.  Redness and swelling in the area of the tick bite.   Tender, swollen lymph glands.   Diarrhea.   Weight loss.   Cough.   Fatigue.   Muscle, joint, or bone pain.   Abdominal pain.   Headache.   Lethargy or a change in your level of consciousness.  Difficulty walking or moving your legs.   Numbness in the legs.   Paralysis.  Shortness of breath.   Confusion.   Repeated vomiting.  Document Released: 06/17/2000 Document Revised: 04/10/2013 Document Reviewed: 11/28/2012 [facility] Patient Information 2014 [place].      [name]. Hena Ewalt M.D.  Pre visit review using our clinic review tool, if applicable. No additional management support is needed unless otherwise documented below in the visit note.

## 2014-01-07 ENCOUNTER — Telehealth: Payer: Self-pay | Admitting: Internal Medicine

## 2014-01-07 DIAGNOSIS — E063 Autoimmune thyroiditis: Secondary | ICD-10-CM

## 2014-01-07 NOTE — Telephone Encounter (Signed)
Pt called and is requesting a referral to endocrinology and would like to know if she needs to come in first to have her thyroid check

## 2014-01-07 NOTE — Telephone Encounter (Signed)
Need more information.  Please find out why the pt is asking for a referral?

## 2014-01-08 NOTE — Telephone Encounter (Signed)
Pt states she was having trouble with her thyroid over a year ago, and dr.panosh  Referred her  to an endocrinologist, and she states she is having trouble again.

## 2014-01-08 NOTE — Telephone Encounter (Signed)
Ok to refer.

## 2014-01-09 NOTE — Telephone Encounter (Signed)
Order placed in the system. 

## 2014-01-23 ENCOUNTER — Ambulatory Visit (INDEPENDENT_AMBULATORY_CARE_PROVIDER_SITE_OTHER): Payer: BC Managed Care – PPO | Admitting: Internal Medicine

## 2014-01-23 ENCOUNTER — Encounter: Payer: Self-pay | Admitting: Internal Medicine

## 2014-01-23 VITALS — BP 100/60 | HR 75 | Temp 98.4°F | Resp 12 | Ht 62.5 in | Wt 117.0 lb

## 2014-01-23 DIAGNOSIS — E063 Autoimmune thyroiditis: Secondary | ICD-10-CM

## 2014-01-23 DIAGNOSIS — N926 Irregular menstruation, unspecified: Secondary | ICD-10-CM

## 2014-01-23 LAB — T4, FREE: FREE T4: 0.93 ng/dL (ref 0.60–1.60)

## 2014-01-23 LAB — TSH: TSH: 0.56 u[IU]/mL (ref 0.35–4.50)

## 2014-01-23 LAB — FOLLICLE STIMULATING HORMONE: FSH: 93.2 m[IU]/mL

## 2014-01-23 LAB — T3, FREE: T3 FREE: 2.6 pg/mL (ref 2.3–4.2)

## 2014-01-23 LAB — HCG, QUANTITATIVE, PREGNANCY: Quantitative HCG: 1.75 m[IU]/mL

## 2014-01-23 NOTE — Progress Notes (Signed)
Patient ID: Toni Bradley, female   DOB: [birth year], 43 y.o.   MRN: [id number]   HPI  Toni Bradley is a 43 y.o.-year-old female, referred by her PCP, Dr. Regis Bill, for evaluation for Hashimoto's thyroiditis. ObGyn: Dr Louretta Shorten.  Pt. has been dx with Hashimoto's thyroiditis in 2009; not on Levothyroxine, as her TFTs have always been normal. However, she c/o a series of sxs (see below) that she read online that are due to hypothyroidism.  She saw an endocrinologist 2 years ago >> thyroid was full, but no other findings.  I reviewed pt's thyroid tests: Lab Results  Component Value Date   TSH 4.19 08/19/2013   TSH 2.56 01/22/2013   TSH 2.42 11/23/2012   TSH 3.43 08/14/2012   TSH 1.58 01/16/2012   TSH 2.98 05/23/2011   TSH 1.61 10/15/2010   TSH 2.81 03/11/2010   TSH 2.96 04/29/2009   TSH 2.51 03/02/2009   FREET4 0.76 01/22/2013   FREET4 0.85 11/23/2012   FREET4 0.57* 01/16/2012   FREET4 0.67 10/15/2010   FREET4 0.8 04/29/2009   FREET4 0.8 02/08/2008   FREET4 0.7 09/17/2007  Anti-TPO Abs were 310 in 2009.   Pt denies feeling nodules in neck, hoarseness, dysphagia/odynophagia, SOB with lying down.  Pt describes: - no cold intolerance; + hot flushes - + weight gain: 10 lbs  In 1 year, despite lifting weights, running, and eating "clean" - + fatigue - + poor sleep  - + constipation - + dry skin - + hair falling - no depression, + a little anxiety - menses =  Irregular, now more normal on herbs: wild yam chaste, DHEA, red raspberry She recently had a high FSH (140), which has decreased after taking the above herbal supplements to 6. Before this, she and her husband decided to try to have another baby, but now she changed her mind.  She has + FH of thyroid disorders in: father; twin sister. No FH of thyroid cancer.  No h/o radiation tx to head or neck. No recent use of iodine supplements.  ROS: Constitutional: see HPI, + nocturia xat least once Eyes: no blurry vision, no  xerophthalmia ENT: no sore throat, no nodules palpated in throat, no dysphagia/odynophagia, no hoarseness Cardiovascular: no CP/SOB/palpitations/leg swelling Respiratory: no cough/SOB Gastrointestinal: no N/V/D/+ C Musculoskeletal: no muscle/joint aches Skin: no rashes, + hair loss, + stretch marks (new) Neurological: no tremors/numbness/tingling/dizziness Psychiatric: no depression/anxiety  Past Medical History  Diagnosis Date  . Nasal polyps   . Mood disorder   . Problems related to lack of adequate sleep   . Dry skin   . Recurrent HSV (herpes simplex virus)    Past Surgical History  Procedure Laterality Date  . Ovarian cyst sx  1996  . Childbirth  1998    vaginal  . Nasal polyp surgery      History   Social History  . Marital Status: Married    Spouse Name: N/A    Number of Children: 2: 97 and 44 y/o   Occupational History  . [employer] psychologist   Social History Main Topics  . Smoking status: Never Smoker   . Smokeless tobacco: Never Used  . Alcohol Use: Yes     Comment: occ. - wine, beer - 1-2x a week  . Drug Use: No   Current Outpatient Prescriptions on File Prior to Visit  Medication Sig Dispense Refill  . doxycycline (VIBRAMYCIN) 100 MG capsule Take 1 capsule (100 mg total) by mouth 2 (two) times  daily.  20 capsule  0  . valACYclovir (VALTREX) 500 MG tablet TAKE 1 TABLET (500 MG TOTAL) BY MOUTH 2 (TWO) TIMES DAILY.  60 tablet  0   No current facility-administered medications on file prior to visit.   Allergies  Allergen Reactions  . Nitrofurantoin     REACTION: hives 3-4 days off medication   Family History  Problem Relation Age of Onset  . Diabetes Other   . Thyroid disease Other     Sister  . Other Father     Parathyroid   PE: BP 100/60  Pulse 75  Temp(Src) 98.4 F (36.9 C) (Oral)  Resp 12  Ht 5' 2.5" (1.588 m)  Wt 117 lb (53.071 kg)  BMI 21.05 kg/m2  SpO2 95% Wt Readings from Last 3 Encounters:  01/23/14 117 lb (53.071 kg)  09/27/13  120 lb (54.432 kg)  08/28/13 121 lb (54.885 kg)   Constitutional: normal weight, fit, in NAD Eyes: PERRLA, EOMI, no exophthalmos ENT: moist mucous membranes, no thyromegaly - full, no cervical lymphadenopathy Cardiovascular: RRR, No MRG Respiratory: CTA B Gastrointestinal: abdomen soft, NT, ND, BS+ Musculoskeletal: no deformities, strength intact in all 4 Skin: moist, warm, no rashes Neurological: no tremor with outstretched hands, DTR normal in all 4  ASSESSMENT: 1. Euthyroid Hashimoto's thyroiditis  PLAN:  1. Patient with long-standing Hashimoto's ds, not levothyroxine therapy as her TFTs have always been normal. She appears euthyroid. - I explained the pathology of Hashimoto's thyroiditis and explained that she is euthyroid now, but at higher risk to develop hypothyroidism in the future - she brings a printed article that mentions that the TSH has to be <2. I explained that the ULN is usually considered the one of the lab, with only lower targets for pregnant pts. That being said, we may use a low dose LT4 if her TSH is closer to the ULN to see if she feels better - She does not appear to have a goiter, thyroid nodules, or neck compression symptoms - we'll check thyroid tests today: TSH, free T4, free T3 - If these are abnormal, she will need to return in 6-8 weeks for repeat labs - If these are normal, I will see her back in 4 months  Office Visit on 01/23/2014  Component Date Value Ref Range Status  . [facility] 01/23/2014 93.2   Final   Female Reference Range:  1.4-18.1 mIU/mLFemale Reference Range:Follicular Phase          2.5-10.2 mIU/mLMidCycle Peak          3.4-33.4 mIU/mLLuteal Phase          1.5-9.1 mIU/mLPost Menopausal     23.0-116.3 mIU/mLPregnant          <0.3 mIU/mL  . Quantitative HCG 01/23/2014 1.75   Final   Non-Pregnant Females >20yr and <1yr=0.0-0.6(mIU/ml)Non-Pregnant Females >62yrs=0.0-3.1(mIU/ml)Non-Pregnant Females Post-Menopause0.1-11.6(mIU/ml)  . TSH 01/23/2014 0.56   0.35 - 4.50 uIU/mL Final  . Free T4 01/23/2014 0.93  0.60 - 1.60 ng/dL Final  . T3, Free 01/23/2014 2.6  2.3 - 4.2 pg/mL Final   LMP 01/08/2014.  [place] high. She might be at or close to ovulation but [facility] being so high is more indicative of menopause. This is likely the cause for her sxs: fatigue, hair loss, poor sleep, weight gain. Not pregnant. TSH low-normal, cannot start LT4 safely.

## 2014-01-23 NOTE — Patient Instructions (Signed)
Please stop at the lab.  Please come back for a follow-up appointment in 4 months.   

## 2014-01-24 ENCOUNTER — Encounter: Payer: Self-pay | Admitting: Internal Medicine

## 2014-01-31 ENCOUNTER — Encounter: Payer: Self-pay | Admitting: Internal Medicine

## 2014-01-31 NOTE — Progress Notes (Signed)
Called back left message on VM

## 2014-02-05 ENCOUNTER — Other Ambulatory Visit: Payer: Self-pay | Admitting: Internal Medicine

## 2014-03-26 ENCOUNTER — Telehealth: Payer: Self-pay | Admitting: Internal Medicine

## 2014-03-26 NOTE — Telephone Encounter (Signed)
Pt has 4 pm apt mon would like to sch thyroid labs prior to appt bc she is sure that is the issue . Pt has had problems in past w/ this, please advise  Pt aware dr Regis Bill is out and wait to hear

## 2014-03-30 NOTE — Telephone Encounter (Signed)
I was out of office and couldn't access EHR until now. Would not get  labs results  back until after her 4 pm visit   Does she want to get labs and then reschedule or just do the appt and get appropriate labs at that time. ? We may want to do more tests than thyroid ? b12 . Cbc diff . Vit d

## 2014-03-31 ENCOUNTER — Ambulatory Visit (INDEPENDENT_AMBULATORY_CARE_PROVIDER_SITE_OTHER): Payer: BC Managed Care – PPO | Admitting: Internal Medicine

## 2014-03-31 ENCOUNTER — Encounter: Payer: Self-pay | Admitting: Internal Medicine

## 2014-03-31 VITALS — BP 100/60 | Temp 98.2°F | Ht 62.75 in | Wt 120.7 lb

## 2014-03-31 DIAGNOSIS — D72819 Decreased white blood cell count, unspecified: Secondary | ICD-10-CM

## 2014-03-31 DIAGNOSIS — E063 Autoimmune thyroiditis: Secondary | ICD-10-CM

## 2014-03-31 DIAGNOSIS — R5383 Other fatigue: Principal | ICD-10-CM

## 2014-03-31 DIAGNOSIS — R5381 Other malaise: Secondary | ICD-10-CM

## 2014-03-31 DIAGNOSIS — N926 Irregular menstruation, unspecified: Secondary | ICD-10-CM

## 2014-03-31 NOTE — Telephone Encounter (Signed)
Pt will come in for appt today and go from there.

## 2014-03-31 NOTE — Patient Instructions (Signed)
Will notify you  of labs when available.  Then plan follow up.

## 2014-03-31 NOTE — Progress Notes (Signed)
Pre visit review using our clinic review tool, if applicable. No additional management support is needed unless otherwise documented below in the visit note.   Chief Complaint  Patient presents with  . Fatigue    Last week pt stated that she felt terrible.  She had no energy and some nausea.  Started to feel better on Thurs or Fri.  Wanted to keep the appt to discuss having her thyroid level checked.  . Nausea    HPI: Toni Bradley   Comes in today for above reasons.    Last fsh was  April .   Last week drained and tired  Hard to exercise.  Nausea  home hpt last.  lmp 2 weeks ago . Not that normal.spotting  Today . Periods irreg .  No coughing  Yesterday ? If anxiety .  No funny rashes .  ? Gluten  Avoidance helps some sx  Sx . MVi some.  ROS: See pertinent positives and negatives per HPI.no cp sob cough fever joint swelling some back pain but better with some stretching  Has thyroid antibodies and wants to make sure that is not the problem  Asks for female hormone panel  ? If issues   Dr Corinna Capra is gyne   Past Medical History  Diagnosis Date  . Nasal polyps   . Mood disorder   . Problems related to lack of adequate sleep   . Dry skin   . Recurrent HSV (herpes simplex virus)     Family History  Problem Relation Age of Onset  . Diabetes Other   . Thyroid disease Other     Sister  . Other Father     Parathyroid    History   Social History  . Marital Status: Married    Spouse Name: N/A    Number of Children: N/A  . Years of Education: N/A   Social History Main Topics  . Smoking status: Never Smoker   . Smokeless tobacco: Never Used  . Alcohol Use: Yes     Comment: occ.  . Drug Use: None  . Sexual Activity: None   Other Topics Concern  . 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    No ets.    Work out 5 x per week.    6-7 hours              Outpatient Encounter  Prescriptions as of 03/31/2014  Medication Sig  . valACYclovir (VALTREX) 500 MG tablet TAKE 1 TABLET TWICE A DAY  . [DISCONTINUED] doxycycline (VIBRAMYCIN) 100 MG capsule Take 1 capsule (100 mg total) by mouth 2 (two) times daily.    EXAM:  BP 100/60  Temp(Src) 98.2 F (36.8 C) (Oral)  Ht 5' 2.75" (1.594 m)  Wt 120 lb 11.2 oz (54.749 kg)  BMI 21.55 kg/m2  Body mass index is 21.55 kg/(m^2).  GENERAL: vitals reviewed and listed above, alert, oriented, appears well hydrated and in no acute distress slight;y tired but well non toxic  HEENT: atraumatic, conjunctiva  clear, no obvious abnormalities on inspection of external nose and ears OP : no lesion edema or exudate  NECK: no obvious masses on inspection palpation  No nodules  LUNGS: clear to auscultation bilaterally, no wheezes, rales or rhonchi, good air movement CV: HRRR, no clubbing cyanosis or  peripheral edema nl cap refill  Abdomen:  Sof,t normal bowel sounds without hepatosplenomegaly, no guarding rebound or masses no CVA tenderness  MS: moves all extremities without noticeable focal  abnormality PSYCH: pleasant and cooperative, no obvious depression or anxiety Ski and hair no acute findings ASSESSMENT AND PLAN:  Discussed the following assessment and plan:  Other malaise and fatigue - Plan: TSH, T4, free, T3, free, CBC with Differential, Vitamin B12, ANA, Progesterone, Luteinizing Hormone, Follicle Stimulating Hormone, Estradiol  Autoimmune thyroiditis - Plan: TSH, T4, free, T3, free, CBC with Differential, Vitamin B12, ANA, Progesterone, Luteinizing Hormone, Follicle Stimulating Hormone, Estradiol  Irregular menstrual cycle - lmp sept 16 or 17th - Plan: TSH, T4, free, T3, free, CBC with Differential, Vitamin B12, ANA, Progesterone, Luteinizing Hormone, Follicle Stimulating Hormone, Estradiol  LEUKOPENIA, MILD - Plan: TSH, T4, free, T3, free, CBC with Differential, Vitamin B12, ANA, Progesterone, Luteinizing Hormone, Follicle  Stimulating Hormone, Estradiol reviewed concerns see my chart message  Thinks she understand at this time  No hx of autoimmune b12 or celiac in family and screenin done in past  No obv rheum disease but check ana with caution today  -Patient advised to return or notify health care team  if symptoms worsen ,persist or new concerns arise. Depending on labs  Patient Instructions  Will notify you  of labs when available.  Then plan follow up.   [name]. [name] M.D.

## 2014-04-01 LAB — CBC WITH DIFFERENTIAL/PLATELET
BASOS ABS: 0 10*3/uL (ref 0.0–0.1)
Basophils Relative: 0.5 % (ref 0.0–3.0)
EOS ABS: 0.2 10*3/uL (ref 0.0–0.7)
Eosinophils Relative: 4.2 % (ref 0.0–5.0)
HEMATOCRIT: 41 % (ref 36.0–46.0)
Hemoglobin: 13.9 g/dL (ref 12.0–15.0)
LYMPHS ABS: 1.9 10*3/uL (ref 0.7–4.0)
Lymphocytes Relative: 38.9 % (ref 12.0–46.0)
MCHC: 33.8 g/dL (ref 30.0–36.0)
MCV: 95.5 fl (ref 78.0–100.0)
MONOS PCT: 6.2 % (ref 3.0–12.0)
Monocytes Absolute: 0.3 10*3/uL (ref 0.1–1.0)
NEUTROS ABS: 2.5 10*3/uL (ref 1.4–7.7)
Neutrophils Relative %: 50.2 % (ref 43.0–77.0)
PLATELETS: 248 10*3/uL (ref 150.0–400.0)
RBC: 4.29 Mil/uL (ref 3.87–5.11)
RDW: 13.3 % (ref 11.5–15.5)
WBC: 4.9 10*3/uL (ref 4.0–10.5)

## 2014-04-01 LAB — T4, FREE: FREE T4: 0.8 ng/dL (ref 0.60–1.60)

## 2014-04-01 LAB — PROGESTERONE: Progesterone: 0.3 ng/mL

## 2014-04-01 LAB — TSH: TSH: 1.06 u[IU]/mL (ref 0.35–4.50)

## 2014-04-01 LAB — ESTRADIOL: Estradiol: 36.2 pg/mL

## 2014-04-01 LAB — LUTEINIZING HORMONE: LH: 34.95 m[IU]/mL

## 2014-04-01 LAB — T3, FREE: T3 FREE: 2.7 pg/mL (ref 2.3–4.2)

## 2014-04-01 LAB — FOLLICLE STIMULATING HORMONE: FSH: 98.9 m[IU]/mL

## 2014-04-01 LAB — VITAMIN B12: VITAMIN B 12: 742 pg/mL (ref 211–911)

## 2014-04-01 NOTE — Telephone Encounter (Signed)
Disc with pat at ov [phone]

## 2014-04-02 LAB — ANA: Anti Nuclear Antibody(ANA): NEGATIVE

## 2014-04-05 DIAGNOSIS — R5383 Other fatigue: Principal | ICD-10-CM

## 2014-04-05 DIAGNOSIS — R5381 Other malaise: Secondary | ICD-10-CM | POA: Insufficient documentation

## 2014-05-26 ENCOUNTER — Ambulatory Visit (INDEPENDENT_AMBULATORY_CARE_PROVIDER_SITE_OTHER): Payer: BC Managed Care – PPO | Admitting: Internal Medicine

## 2014-05-26 ENCOUNTER — Encounter: Payer: Self-pay | Admitting: Internal Medicine

## 2014-05-26 VITALS — BP 104/62 | HR 74 | Temp 98.2°F | Resp 12 | Wt 120.0 lb

## 2014-05-26 DIAGNOSIS — E063 Autoimmune thyroiditis: Secondary | ICD-10-CM

## 2014-05-26 LAB — T4, FREE: FREE T4: 0.79 ng/dL (ref 0.60–1.60)

## 2014-05-26 LAB — TSH: TSH: 4.04 u[IU]/mL (ref 0.35–4.50)

## 2014-05-26 LAB — T3, FREE: T3 FREE: 2.8 pg/mL (ref 2.3–4.2)

## 2014-05-26 NOTE — Progress Notes (Signed)
Patient ID: Toni Bradley, female   DOB: 03-22-1971, 43 y.o.   MRN: [id number]   HPI  Toni Bradley is a 43 y.o.-year-old female, returning for f/u for Hashimoto's thyroiditis. ObGyn: Dr Louretta Shorten.  Reviewed hx: Pt. has been dx with Hashimoto's thyroiditis in 2009; not on Levothyroxine, as her TFTs have always been normal. However, she c/o a series of sxs (see below) that she read online that are due to hypothyroidism. She saw an endocrinologist 2 years ago >> thyroid was full, but no other findings.  I reviewed pt's thyroid tests: Lab Results  Component Value Date   TSH 1.06 03/31/2014   TSH 0.56 01/23/2014   TSH 4.19 08/19/2013   TSH 2.56 01/22/2013   TSH 2.42 11/23/2012   TSH 3.43 08/14/2012   TSH 1.58 01/16/2012   TSH 2.98 05/23/2011   TSH 1.61 10/15/2010   TSH 2.81 03/11/2010   FREET4 0.80 03/31/2014   FREET4 0.93 01/23/2014   FREET4 0.76 01/22/2013   FREET4 0.85 11/23/2012   FREET4 0.57* 01/16/2012   FREET4 0.67 10/15/2010   FREET4 0.8 04/29/2009   FREET4 0.8 02/08/2008   FREET4 0.7 09/17/2007  Anti-TPO Abs were 310 in 2009.   Pt denies feeling nodules in neck, hoarseness, dysphagia/odynophagia, SOB with lying down.  Pt describes: - no cold intolerance; + hot flushes - + weight gain: lifting weights, running, and eating "clean" - + fatigue - better -  sleep improved - + constipation - + dry skin - improved hair loss, but thinner - no depression, anxiety - regular menses now, but + cramps. She had a high FSH (140) in the last year, ?early menopause.  I reviewed pt's medications, allergies, PMH, social hx, family hx and no changes required, except as mentioned above.  ROS: Constitutional: see HPI,  Eyes: + blurry vision, no xerophthalmia ENT: no sore throat, no nodules palpated in throat, no dysphagia/odynophagia, no hoarseness Cardiovascular: no CP/SOB/palpitations/leg swelling Respiratory: no cough/SOB Gastrointestinal: no N/V/D/+ C Musculoskeletal:  no muscle/joint aches Skin: no rashes, + hair loss Neurological: no tremors/numbness/tingling/dizziness  PE: BP 104/62 mmHg  Pulse 74  Temp(Src) 98.2 F (36.8 C) (Oral)  Resp 12  Wt 120 lb (54.432 kg)  SpO2 97% Wt Readings from Last 3 Encounters:  05/26/14 120 lb (54.432 kg)  03/31/14 120 lb 11.2 oz (54.749 kg)  01/23/14 117 lb (53.071 kg)   Constitutional: normal weight, fit, in NAD Eyes: PERRLA, EOMI, no exophthalmos ENT: moist mucous membranes, no thyromegaly - full, no cervical lymphadenopathy Cardiovascular: RRR, No MRG Respiratory: CTA B Gastrointestinal: abdomen soft, NT, ND, BS+ Musculoskeletal: no deformities, strength intact in all 4 Skin: moist, warm, no rashes Neurological: no tremor with outstretched hands, DTR normal in all 4  ASSESSMENT: 1. Euthyroid Hashimoto's thyroiditis  PLAN:  1. Patient with long-standing Hashimoto's ds, not levothyroxine therapy as her TFTs have always been normal. She appears euthyroid.She feels better lately from the pov of fatigue, sleep and her weight. - we again discussed that the ULN is usually considered the one of the lab, with only lower targets for pregnant pts. That being said, we may use a low dose LT4 if her TSH is closer to the ULN to see if she feels better.  - She does not appear to have a goiter, thyroid nodules, or neck compression symptoms - we'll check thyroid tests today: TSH, free T4, free T3 and will add TPO Ab's  - If these are abnormal, she will need to return in 6-8  weeks for repeat labs - If these are normal, I will see her back in 1 year   Component     Latest Ref Rng 05/26/2014  TSH     0.35 - 4.50 uIU/mL 4.04  Free T4     0.60 - 1.60 ng/dL 0.79  T3, Free     2.3 - 4.2 pg/mL 2.8  Thyroid Peroxidase Antibody     <9 IU/mL 40 (H)   TPO Ab's same. TSH a little higher but still in the nl range.

## 2014-05-26 NOTE — Patient Instructions (Signed)
Please stop at the lab.  Please return in 1 year.  

## 2014-05-27 ENCOUNTER — Encounter: Payer: Self-pay | Admitting: Internal Medicine

## 2014-05-27 LAB — THYROID PEROXIDASE ANTIBODY: THYROID PEROXIDASE ANTIBODY: 40 [IU]/mL — AB (ref <9)

## 2014-06-26 ENCOUNTER — Encounter: Payer: Self-pay | Admitting: Internal Medicine

## 2014-07-01 ENCOUNTER — Other Ambulatory Visit (INDEPENDENT_AMBULATORY_CARE_PROVIDER_SITE_OTHER): Payer: BC Managed Care – PPO

## 2014-07-01 ENCOUNTER — Encounter: Payer: Self-pay | Admitting: Internal Medicine

## 2014-07-01 ENCOUNTER — Other Ambulatory Visit: Payer: Self-pay | Admitting: *Deleted

## 2014-07-01 ENCOUNTER — Other Ambulatory Visit: Payer: Self-pay | Admitting: Internal Medicine

## 2014-07-01 DIAGNOSIS — E063 Autoimmune thyroiditis: Secondary | ICD-10-CM

## 2014-07-01 LAB — T3, FREE: T3 FREE: 2.9 pg/mL (ref 2.3–4.2)

## 2014-07-01 LAB — TSH: TSH: 4.61 u[IU]/mL — ABNORMAL HIGH (ref 0.35–4.50)

## 2014-07-01 LAB — T4, FREE: FREE T4: 0.73 ng/dL (ref 0.60–1.60)

## 2014-07-01 MED ORDER — LEVOTHYROXINE SODIUM 25 MCG PO TABS: 12.5000 ug | ORAL_TABLET | Freq: Every day | ORAL | Status: DC

## 2014-07-03 ENCOUNTER — Other Ambulatory Visit: Payer: BC Managed Care – PPO

## 2014-07-11 IMAGING — MG MM DIGITAL SCREENING W/ IMPLANTS
8 series · 8 of 8 positions shown · non-contrast
Comparison: Previous exam(s)

CLINICAL DATA: Screening.

EXAM:
DIGITAL SCREENING BILATERAL MAMMOGRAM WITH IMPLANTS AND CAD
The patient has retropectoral implants. Standard and implant
displaced views were performed.

[L CC (1 of 2)]
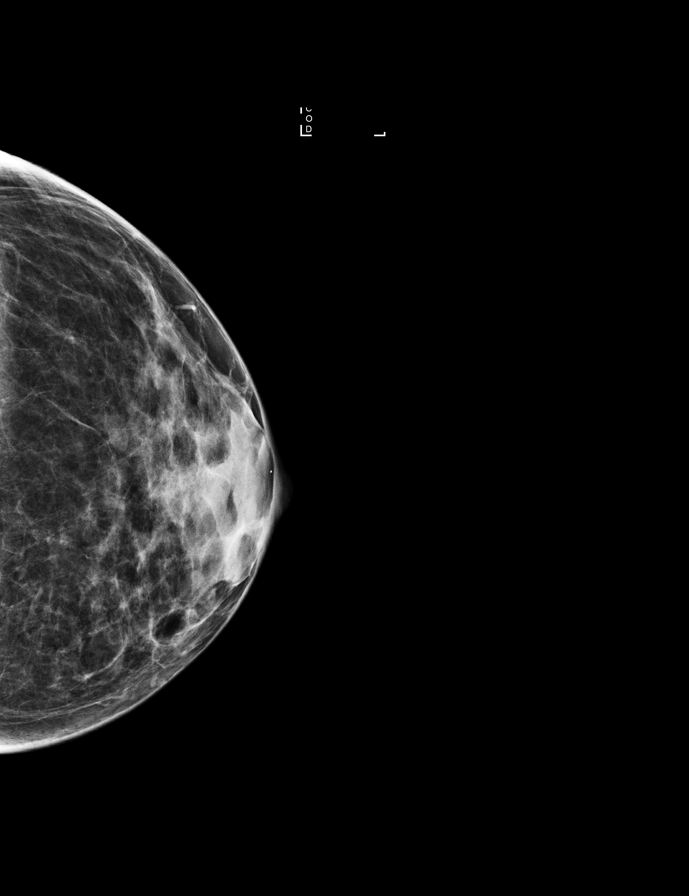

[L MLO (1 of 2)]
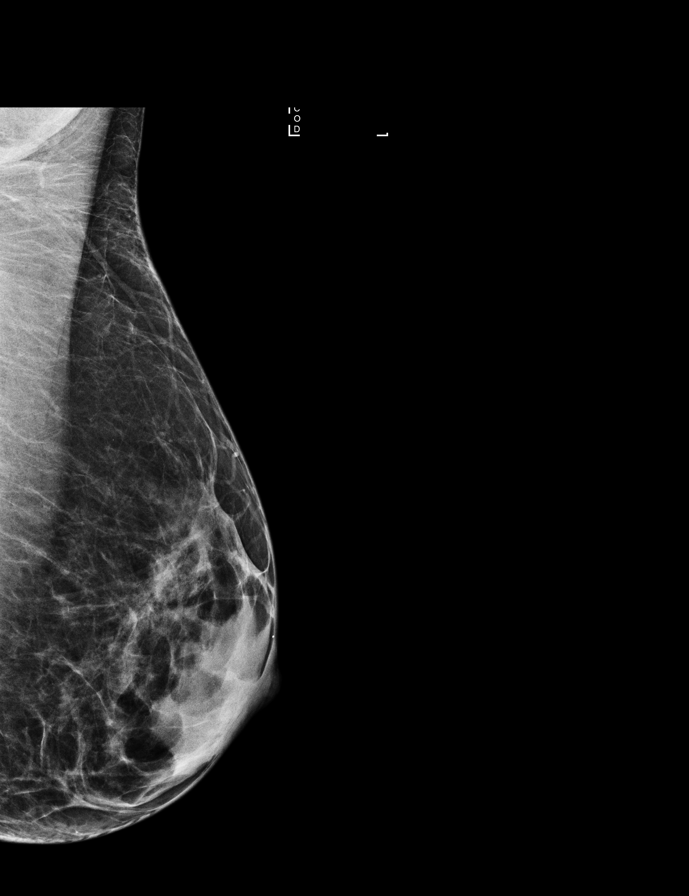

[R MLO (1 of 2)]
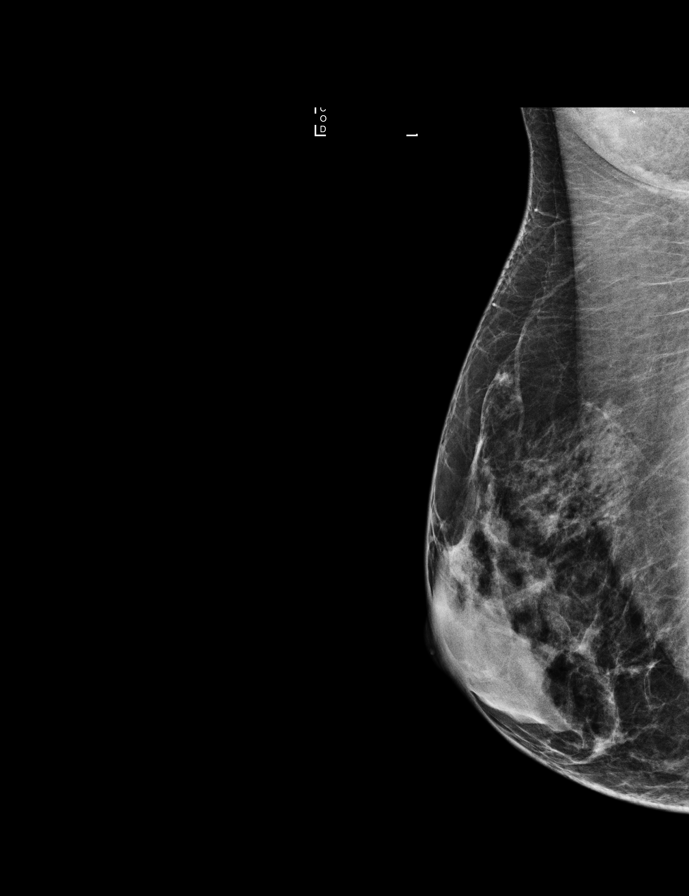

[R CC (1 of 2)]
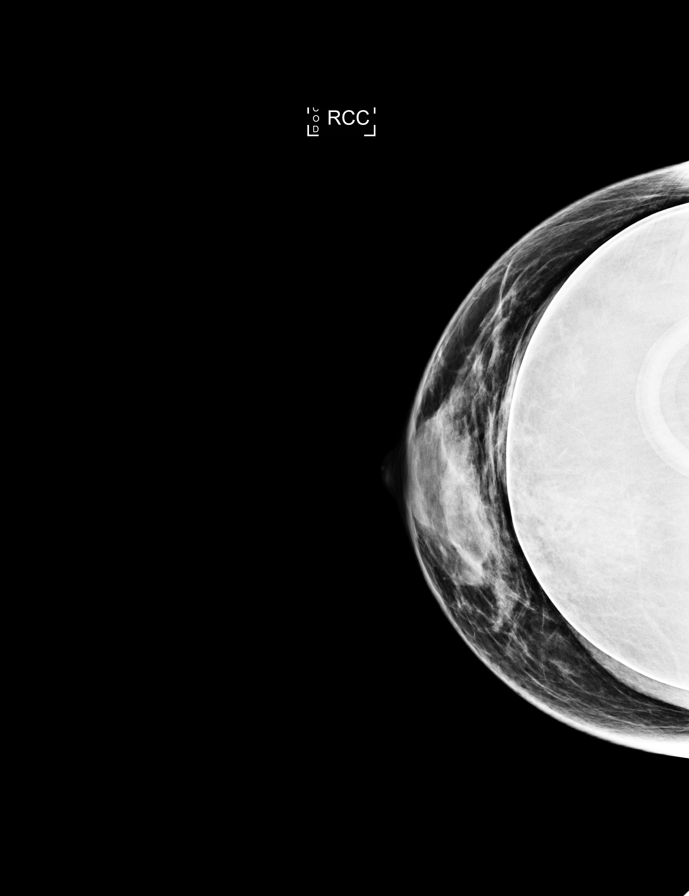

[L MLO (2 of 2)]
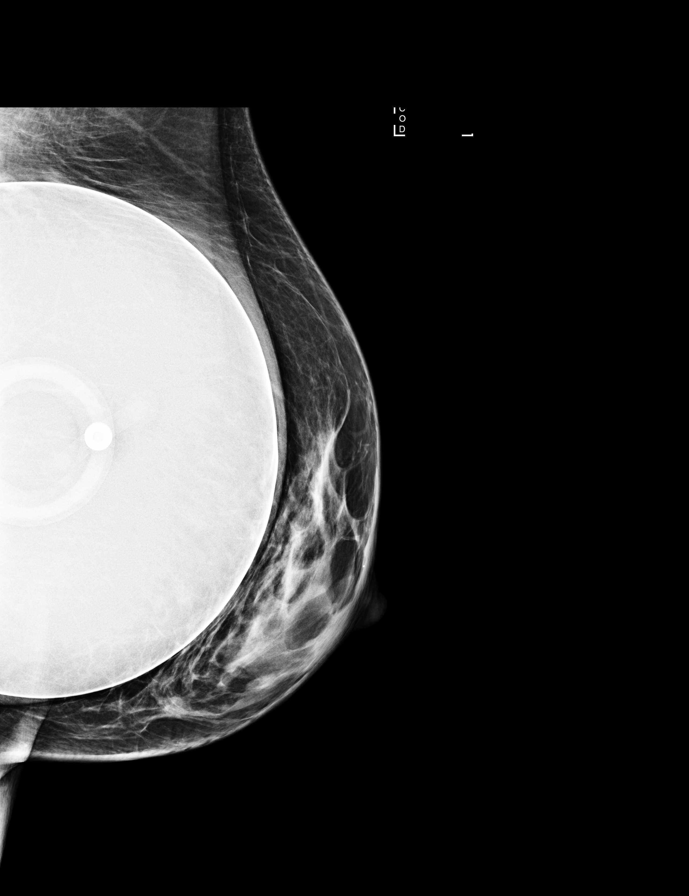

[L CC (2 of 2)]
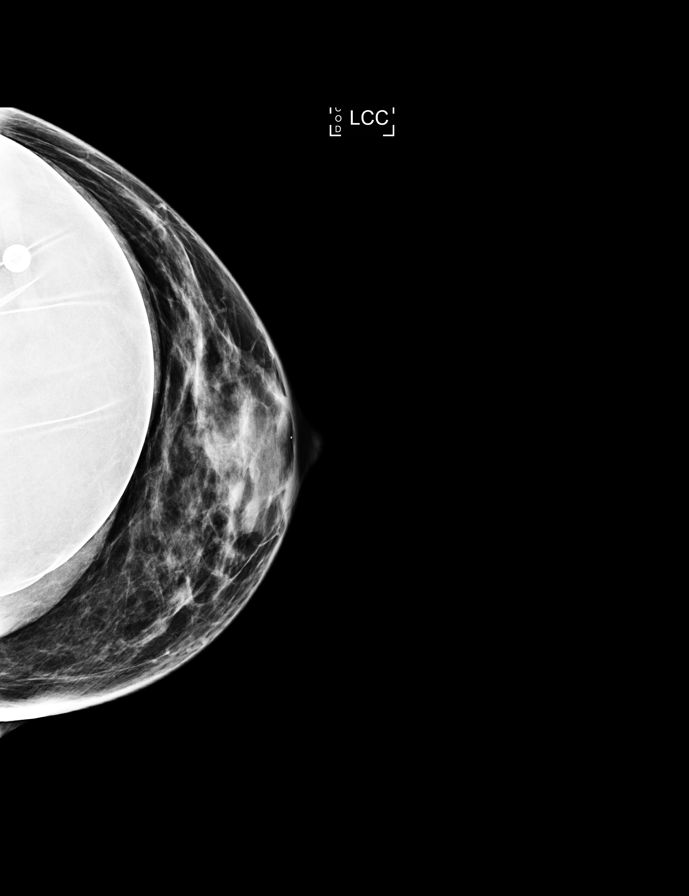

[R MLO (2 of 2)]
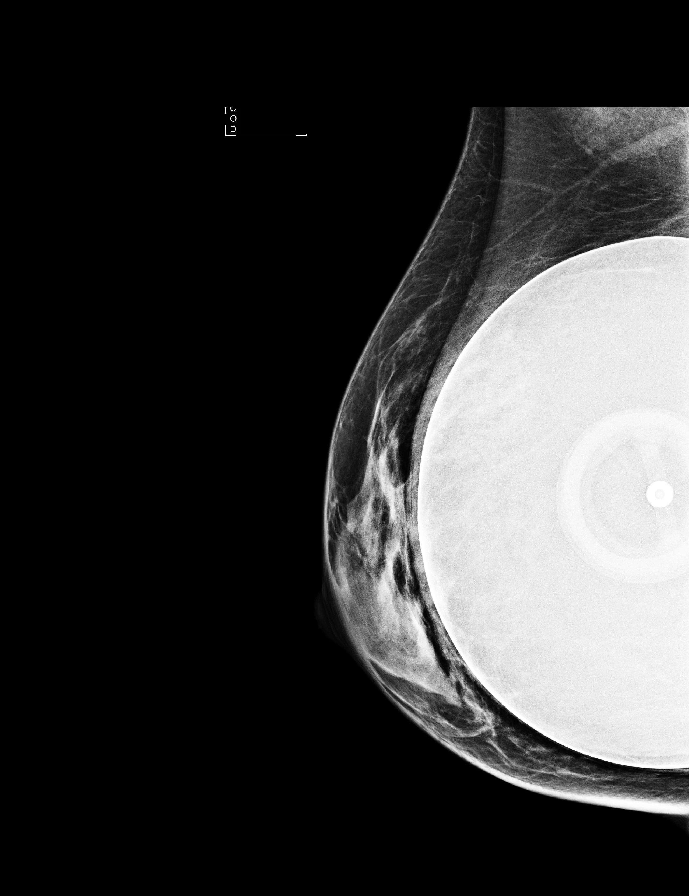

[R CC (2 of 2)]
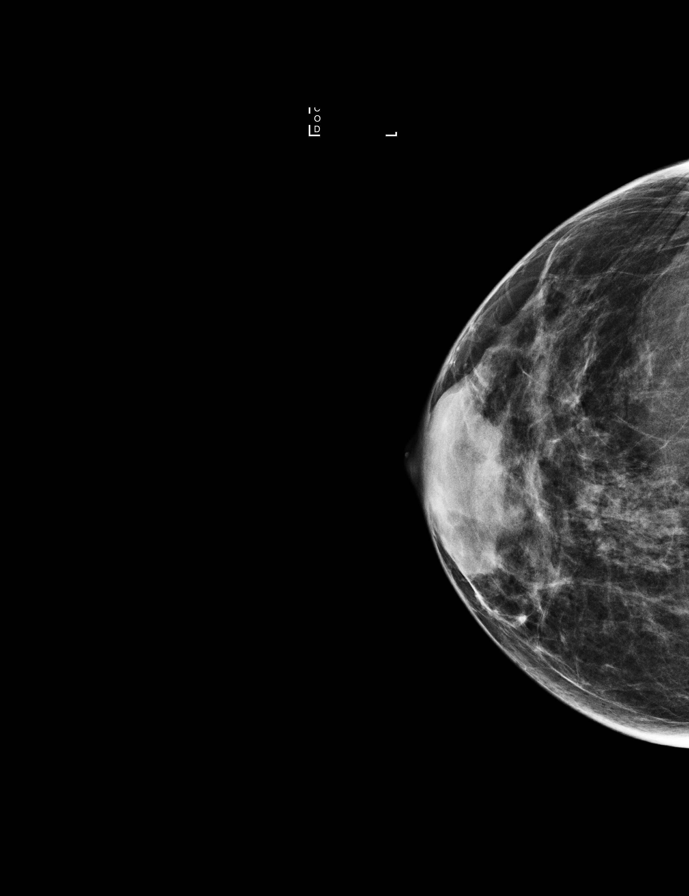

[8 of 8 positions shown; findings below may reference images not displayed]

ACR Breast Density Category c: The breast tissue is heterogeneously
dense, which may obscure small masses.
FINDINGS: There are no findings suspicious for malignancy. Images were
processed with CAD.
IMPRESSION: No mammographic evidence of malignancy. A result letter of this
screening mammogram will be mailed directly to the patient.

RECOMMENDATION:
Screening mammogram in one year. (Code:H5-E-VHS)

BI-RADS CATEGORY  1:  Negative.

## 2014-08-18 ENCOUNTER — Other Ambulatory Visit (INDEPENDENT_AMBULATORY_CARE_PROVIDER_SITE_OTHER): Payer: Self-pay

## 2014-08-18 ENCOUNTER — Encounter: Payer: Self-pay | Admitting: Internal Medicine

## 2014-08-18 DIAGNOSIS — E063 Autoimmune thyroiditis: Secondary | ICD-10-CM

## 2014-08-18 LAB — T4, FREE: Free T4: 0.74 ng/dL (ref 0.60–1.60)

## 2014-08-18 LAB — TSH: TSH: 2.5 u[IU]/mL (ref 0.35–4.50)

## 2014-09-16 ENCOUNTER — Other Ambulatory Visit: Payer: Self-pay | Admitting: Internal Medicine

## 2014-10-26 ENCOUNTER — Encounter: Payer: Self-pay | Admitting: Internal Medicine

## 2014-10-28 ENCOUNTER — Other Ambulatory Visit: Payer: Self-pay | Admitting: *Deleted

## 2014-10-28 DIAGNOSIS — E063 Autoimmune thyroiditis: Secondary | ICD-10-CM

## 2014-11-04 ENCOUNTER — Other Ambulatory Visit: Payer: Self-pay | Admitting: Internal Medicine

## 2014-11-06 ENCOUNTER — Other Ambulatory Visit (INDEPENDENT_AMBULATORY_CARE_PROVIDER_SITE_OTHER): Payer: BLUE CROSS/BLUE SHIELD

## 2014-11-06 DIAGNOSIS — E063 Autoimmune thyroiditis: Secondary | ICD-10-CM | POA: Diagnosis not present

## 2014-11-06 LAB — T4, FREE: FREE T4: 0.78 ng/dL (ref 0.60–1.60)

## 2014-11-06 LAB — TSH: TSH: 3.56 u[IU]/mL (ref 0.35–4.50)

## 2014-12-03 ENCOUNTER — Other Ambulatory Visit: Payer: Self-pay | Admitting: *Deleted

## 2014-12-03 ENCOUNTER — Encounter: Payer: Self-pay | Admitting: Internal Medicine

## 2014-12-03 MED ORDER — SYNTHROID 25 MCG PO TABS: ORAL_TABLET | ORAL | Status: DC

## 2014-12-03 NOTE — Telephone Encounter (Signed)
Formulary change, ok per Dr Cruzita Lederer, to Synthroid brand.

## 2015-01-01 ENCOUNTER — Encounter: Payer: Self-pay | Admitting: Internal Medicine

## 2015-01-01 NOTE — Telephone Encounter (Signed)
Ok to refill the cream if helps . But then OV . After that.

## 2015-01-02 MED ORDER — HYDROCORTISONE 2.5 % RE CREA: 1.0000 "application " | TOPICAL_CREAM | Freq: Two times a day (BID) | RECTAL | Status: DC

## 2015-01-02 NOTE — Telephone Encounter (Signed)
Can tell but can try anusol hc cream  med short term

## 2015-01-14 ENCOUNTER — Encounter: Payer: Self-pay | Admitting: Internal Medicine

## 2015-01-14 ENCOUNTER — Other Ambulatory Visit: Payer: Self-pay | Admitting: *Deleted

## 2015-01-14 MED ORDER — SYNTHROID 25 MCG PO TABS: ORAL_TABLET | ORAL | Status: DC

## 2015-01-14 NOTE — Telephone Encounter (Signed)
Pt requested refill via MyChart.

## 2015-01-14 NOTE — Telephone Encounter (Signed)
Refill sent to pt's pharmacy. Please read message below.

## 2015-01-15 ENCOUNTER — Other Ambulatory Visit: Payer: BLUE CROSS/BLUE SHIELD

## 2015-01-15 ENCOUNTER — Other Ambulatory Visit: Payer: Self-pay | Admitting: *Deleted

## 2015-01-15 DIAGNOSIS — R946 Abnormal results of thyroid function studies: Secondary | ICD-10-CM

## 2015-01-15 NOTE — Telephone Encounter (Signed)
Pt wants to have panel for hashimotos to be drawn today

## 2015-01-16 ENCOUNTER — Encounter: Payer: Self-pay | Admitting: Internal Medicine

## 2015-01-16 ENCOUNTER — Other Ambulatory Visit: Payer: Self-pay | Admitting: Internal Medicine

## 2015-01-16 ENCOUNTER — Other Ambulatory Visit (INDEPENDENT_AMBULATORY_CARE_PROVIDER_SITE_OTHER): Payer: BLUE CROSS/BLUE SHIELD

## 2015-01-16 DIAGNOSIS — E063 Autoimmune thyroiditis: Secondary | ICD-10-CM

## 2015-01-16 DIAGNOSIS — R946 Abnormal results of thyroid function studies: Secondary | ICD-10-CM

## 2015-01-16 LAB — T3, FREE: T3, Free: 3.1 pg/mL (ref 2.3–4.2)

## 2015-01-16 LAB — TSH: TSH: 2.66 u[IU]/mL (ref 0.35–4.50)

## 2015-01-16 LAB — T4, FREE: Free T4: 0.85 ng/dL (ref 0.60–1.60)

## 2015-01-16 MED ORDER — SYNTHROID 25 MCG PO TABS: ORAL_TABLET | ORAL | Status: DC

## 2015-03-25 ENCOUNTER — Other Ambulatory Visit: Payer: Self-pay | Admitting: Internal Medicine

## 2015-05-23 ENCOUNTER — Encounter: Payer: Self-pay | Admitting: Internal Medicine

## 2015-05-26 ENCOUNTER — Other Ambulatory Visit: Payer: BLUE CROSS/BLUE SHIELD

## 2015-05-27 ENCOUNTER — Other Ambulatory Visit (INDEPENDENT_AMBULATORY_CARE_PROVIDER_SITE_OTHER): Payer: BLUE CROSS/BLUE SHIELD

## 2015-05-27 DIAGNOSIS — Z0000 Encounter for general adult medical examination without abnormal findings: Secondary | ICD-10-CM | POA: Diagnosis not present

## 2015-05-27 LAB — CBC WITH DIFFERENTIAL/PLATELET
Basophils Absolute: 0 10*3/uL (ref 0.0–0.1)
Basophils Relative: 0.4 % (ref 0.0–3.0)
EOS PCT: 4 % (ref 0.0–5.0)
Eosinophils Absolute: 0.2 10*3/uL (ref 0.0–0.7)
HCT: 42.4 % (ref 36.0–46.0)
HEMOGLOBIN: 14.2 g/dL (ref 12.0–15.0)
Lymphocytes Relative: 34.7 % (ref 12.0–46.0)
Lymphs Abs: 1.6 10*3/uL (ref 0.7–4.0)
MCHC: 33.5 g/dL (ref 30.0–36.0)
MCV: 95 fl (ref 78.0–100.0)
MONOS PCT: 8.9 % (ref 3.0–12.0)
Monocytes Absolute: 0.4 10*3/uL (ref 0.1–1.0)
Neutro Abs: 2.4 10*3/uL (ref 1.4–7.7)
Neutrophils Relative %: 52 % (ref 43.0–77.0)
Platelets: 248 10*3/uL (ref 150.0–400.0)
RBC: 4.47 Mil/uL (ref 3.87–5.11)
RDW: 13 % (ref 11.5–15.5)
WBC: 4.6 10*3/uL (ref 4.0–10.5)

## 2015-05-27 LAB — BASIC METABOLIC PANEL
BUN: 10 mg/dL (ref 6–23)
CO2: 29 mEq/L (ref 19–32)
Calcium: 9.7 mg/dL (ref 8.4–10.5)
Chloride: 102 mEq/L (ref 96–112)
Creatinine, Ser: 0.85 mg/dL (ref 0.40–1.20)
GFR: 77.54 mL/min (ref >60.00)
Glucose, Bld: 86 mg/dL (ref 70–99)
POTASSIUM: 4 meq/L (ref 3.5–5.1)
SODIUM: 140 meq/L (ref 135–145)

## 2015-05-27 LAB — HEPATIC FUNCTION PANEL
ALT: 15 U/L (ref 0–35)
AST: 23 U/L (ref 0–37)
Albumin: 4.3 g/dL (ref 3.5–5.2)
Alkaline Phosphatase: 45 U/L (ref 39–117)
BILIRUBIN DIRECT: 0.1 mg/dL (ref 0.0–0.3)
BILIRUBIN TOTAL: 0.6 mg/dL (ref 0.2–1.2)
Total Protein: 7 g/dL (ref 6.0–8.3)

## 2015-05-27 LAB — TSH: TSH: 3.52 u[IU]/mL (ref 0.35–4.50)

## 2015-05-27 LAB — LIPID PANEL
CHOL/HDL RATIO: 2
CHOLESTEROL: 152 mg/dL (ref 0–200)
HDL: 72.8 mg/dL (ref >39.00)
LDL CALC: 66 mg/dL (ref 0–99)
NonHDL: 78.84
TRIGLYCERIDES: 64 mg/dL (ref 0.0–149.0)
VLDL: 12.8 mg/dL (ref 0.0–40.0)

## 2015-05-27 NOTE — Telephone Encounter (Signed)
These are nor routine tests for prevention  Would need a diagnosis code and   Give [place] symptoms  Or concerns     your insurance may or may not pay for these tests .   Please ask lab if they have enough blood to do these tests .

## 2015-06-03 ENCOUNTER — Ambulatory Visit (INDEPENDENT_AMBULATORY_CARE_PROVIDER_SITE_OTHER): Payer: BLUE CROSS/BLUE SHIELD | Admitting: Internal Medicine

## 2015-06-03 ENCOUNTER — Encounter: Payer: Self-pay | Admitting: Internal Medicine

## 2015-06-03 VITALS — BP 96/66 | Temp 98.1°F | Ht 62.0 in | Wt 123.0 lb

## 2015-06-03 DIAGNOSIS — E063 Autoimmune thyroiditis: Secondary | ICD-10-CM | POA: Diagnosis not present

## 2015-06-03 DIAGNOSIS — Z0000 Encounter for general adult medical examination without abnormal findings: Secondary | ICD-10-CM

## 2015-06-03 DIAGNOSIS — R41840 Attention and concentration deficit: Secondary | ICD-10-CM | POA: Diagnosis not present

## 2015-06-03 DIAGNOSIS — L290 Pruritus ani: Secondary | ICD-10-CM | POA: Diagnosis not present

## 2015-06-03 DIAGNOSIS — M545 Low back pain: Secondary | ICD-10-CM

## 2015-06-03 NOTE — Patient Instructions (Addendum)
Take synthroid Without the vitamins .  By  Silvio Clayman plain  Or witch hazel. want to avoid routine hydrocortisone.   To avoid rebound .  Talk with gyne about the hormone   Problems .   Attend to back posture .    Avoid prolonged sitting .  Cross training for back pain .   Attention concentration  is effected by many things including sleep stress  , medications menopausal issues  Attend to doing one think at at time  To avoid rapid switching of tasks  Contact us if   You wish to do more evaluation  With psychology about concentration   Wt Readings from Last 3 Encounters:  06/03/15 123 lb (55.792 kg)  05/26/14 120 lb (54.432 kg)  03/31/14 120 lb 11.2 oz (54.749 kg)       Health Maintenance, Female Adopting a healthy lifestyle and getting preventive care can go a long way to promote health and wellness. Talk with your health care provider about what schedule of regular examinations is right for you. This is a good chance for you to check in with your provider about disease prevention and staying healthy. In between checkups, there are plenty of things you can do on your own. Experts have done a lot of research about which lifestyle changes and preventive measures are most likely to keep you healthy. Ask your health care provider for more information. WEIGHT AND DIET  Eat a healthy diet  Be sure to include plenty of vegetables, fruits, low-fat dairy products, and lean protein.  Do not eat a lot of foods high in solid fats, added sugars, or salt.  Get regular exercise. This is one of the most important things you can do for your health.  Most adults should exercise for at least 150 minutes each week. The exercise should increase your heart rate and make you sweat (moderate-intensity exercise).  Most adults should also do strengthening exercises at least twice a week. This is in addition to the moderate-intensity exercise.  Maintain a healthy weight  Body mass index (BMI) is a  measurement that can be used to identify possible weight problems. It estimates body fat based on height and weight. Your health care provider can help determine your BMI and help you achieve or maintain a healthy weight.  For females 57 years of age and older:   A BMI below 18.5 is considered underweight.  A BMI of 18.5 to 24.9 is normal.  A BMI of 25 to 29.9 is considered overweight.  A BMI of 30 and above is considered obese.  Watch levels of cholesterol and blood lipids  You should start having your blood tested for lipids and cholesterol at 43 years of age, then have this test every 5 years.  You may need to have your cholesterol levels checked more often if:  Your lipid or cholesterol levels are high.  You are older than 43 years of age.  You are at high risk for heart disease.  CANCER SCREENING   Lung Cancer  Lung cancer screening is recommended for adults 56-86 years old who are at high risk for lung cancer because of a history of smoking.  A yearly low-dose CT scan of the lungs is recommended for people who:  Currently smoke.  Have quit within the past 15 years.  Have at least a 30-pack-year history of smoking. A pack year is smoking an average of one pack of cigarettes a day for 1 year.  Yearly  screening should continue until it has been 15 years since you quit.  Yearly screening should stop if you develop a health problem that would prevent you from having lung cancer treatment.  Breast Cancer  Practice breast self-awareness. This means understanding how your breasts normally appear and feel.  It also means doing regular breast self-exams. Let your health care provider know about any changes, no matter how small.  If you are in your 20s or 30s, you should have a clinical breast exam (CBE) by a health care provider every 1-3 years as part of a regular health exam.  If you are 45 or older, have a CBE every year. Also consider having a breast X-ray  (mammogram) every year.  If you have a family history of breast cancer, talk to your health care provider about genetic screening.  If you are at high risk for breast cancer, talk to your health care provider about having an MRI and a mammogram every year.  Breast cancer gene (BRCA) assessment is recommended for women who have family members with BRCA-related cancers. BRCA-related cancers include:  Breast.  Ovarian.  Tubal.  Peritoneal cancers.  Results of the assessment will determine the need for genetic counseling and BRCA1 and BRCA2 testing. Cervical Cancer Your health care provider may recommend that you be screened regularly for cancer of the pelvic organs (ovaries, uterus, and vagina). This screening involves a pelvic examination, including checking for microscopic changes to the surface of your cervix (Pap test). You may be encouraged to have this screening done every 3 years, beginning at age 44.  For women ages 32-65, health care providers may recommend pelvic exams and Pap testing every 3 years, or they may recommend the Pap and pelvic exam, combined with testing for human papilloma virus (HPV), every 5 years. Some types of HPV increase your risk of cervical cancer. Testing for HPV may also be done on women of any age with unclear Pap test results.  Other health care providers may not recommend any screening for nonpregnant women who are considered low risk for pelvic cancer and who do not have symptoms. Ask your health care provider if a screening pelvic exam is right for you.  If you have had past treatment for cervical cancer or a condition that could lead to cancer, you need Pap tests and screening for cancer for at least 20 years after your treatment. If Pap tests have been discontinued, your risk factors (such as having a new sexual partner) need to be reassessed to determine if screening should resume. Some women have medical problems that increase the chance of getting  cervical cancer. In these cases, your health care provider may recommend more frequent screening and Pap tests. Colorectal Cancer  This type of cancer can be detected and often prevented.  Routine colorectal cancer screening usually begins at 43 years of age and continues through 43 years of age.  Your health care provider may recommend screening at an earlier age if you have risk factors for colon cancer.  Your health care provider may also recommend using home test kits to check for hidden blood in the stool.  A small camera at the end of a tube can be used to examine your colon directly (sigmoidoscopy or colonoscopy). This is done to check for the earliest forms of colorectal cancer.  Routine screening usually begins at age 32.  Direct examination of the colon should be repeated every 5-10 years through 43 years of age. However, you may  need to be screened more often if early forms of precancerous polyps or small growths are found. Skin Cancer  Check your skin from head to toe regularly.  Tell your health care provider about any new moles or changes in moles, especially if there is a change in a mole's shape or color.  Also tell your health care provider if you have a mole that is larger than the size of a pencil eraser.  Always use sunscreen. Apply sunscreen liberally and repeatedly throughout the day.  Protect yourself by wearing long sleeves, pants, a wide-brimmed hat, and sunglasses whenever you are outside. HEART DISEASE, DIABETES, AND HIGH BLOOD PRESSURE   High blood pressure causes heart disease and increases the risk of stroke. High blood pressure is more likely to develop in:  People who have blood pressure in the high end of the normal range (130-139/85-89 mm Hg).  People who are overweight or obese.  People who are African American.  If you are 58-80 years of age, have your blood pressure checked every 3-5 years. If you are 80 years of age or older, have your blood  pressure checked every year. You should have your blood pressure measured twice--once when you are at a hospital or clinic, and once when you are not at a hospital or clinic. Record the average of the two measurements. To check your blood pressure when you are not at a hospital or clinic, you can use:  An automated blood pressure machine at a pharmacy.  A home blood pressure monitor.  If you are between 27 years and 16 years old, ask your health care provider if you should take aspirin to prevent strokes.  Have regular diabetes screenings. This involves taking a blood sample to check your fasting blood sugar level.  If you are at a normal weight and have a low risk for diabetes, have this test once every three years after 43 years of age.  If you are overweight and have a high risk for diabetes, consider being tested at a younger age or more often. PREVENTING INFECTION  Hepatitis B  If you have a higher risk for hepatitis B, you should be screened for this virus. You are considered at high risk for hepatitis B if:  You were born in a country where hepatitis B is common. Ask your health care provider which countries are considered high risk.  Your parents were born in a high-risk country, and you have not been immunized against hepatitis B (hepatitis B vaccine).  You have HIV or AIDS.  You use needles to inject street drugs.  You live with someone who has hepatitis B.  You have had sex with someone who has hepatitis B.  You get hemodialysis treatment.  You take certain medicines for conditions, including cancer, organ transplantation, and autoimmune conditions. Hepatitis C  Blood testing is recommended for:  Everyone born from [birth year] through 1964.  Anyone with known risk factors for hepatitis C. Sexually transmitted infections (STIs)  You should be screened for sexually transmitted infections (STIs) including gonorrhea and chlamydia if:  You are sexually active and are  younger than 43 years of age.  You are older than 43 years of age and your health care provider tells you that you are at risk for this type of infection.  Your sexual activity has changed since you were last screened and you are at an increased risk for chlamydia or gonorrhea. Ask your health care provider if you are at risk.  If you do not have HIV, but are at risk, it may be recommended that you take a prescription medicine daily to prevent HIV infection. This is called pre-exposure prophylaxis (PrEP). You are considered at risk if:  You are sexually active and do not regularly use condoms or know the HIV status of your partner(s).  You take drugs by injection.  You are sexually active with a partner who has HIV. Talk with your health care provider about whether you are at high risk of being infected with HIV. If you choose to begin PrEP, you should first be tested for HIV. You should then be tested every 3 months for as long as you are taking PrEP.  PREGNANCY   If you are premenopausal and you may become pregnant, ask your health care provider about preconception counseling.  If you may become pregnant, take 400 to 800 micrograms (mcg) of folic acid every day.  If you want to prevent pregnancy, talk to your health care provider about birth control (contraception). OSTEOPOROSIS AND MENOPAUSE   Osteoporosis is a disease in which the bones lose minerals and strength with aging. This can result in serious bone fractures. Your risk for osteoporosis can be identified using a bone density scan.  If you are 54 years of age or older, or if you are at risk for osteoporosis and fractures, ask your health care provider if you should be screened.  Ask your health care provider whether you should take a calcium or vitamin D supplement to lower your risk for osteoporosis.  Menopause may have certain physical symptoms and risks.  Hormone replacement therapy may reduce some of these symptoms and  risks. Talk to your health care provider about whether hormone replacement therapy is right for you.  HOME CARE INSTRUCTIONS   Schedule regular health, dental, and eye exams.  Stay current with your immunizations.   Do not use any tobacco products including cigarettes, chewing tobacco, or electronic cigarettes.  If you are pregnant, do not drink alcohol.  If you are breastfeeding, limit how much and how often you drink alcohol.  Limit alcohol intake to no more than 1 drink per day for nonpregnant women. One drink equals 12 ounces of beer, 5 ounces of wine, or 1 ounces of hard liquor.  Do not use street drugs.  Do not share needles.  Ask your health care provider for help if you need support or information about quitting drugs.  Tell your health care provider if you often feel depressed.  Tell your health care provider if you have ever been abused or do not feel safe at home.   This information is not intended to replace advice given to you by your health care provider. Make sure you discuss any questions you have with your health care provider.   Document Released: 01/03/2011 Document Revised: 07/11/2014 Document Reviewed: 05/22/2013 [employer]vier Interactive Patient Education [employer].

## 2015-06-03 NOTE — Progress Notes (Signed)
Pre visit review using our clinic review tool, if applicable. No additional management support is needed unless otherwise documented below in the visit note.  Chief Complaint  Patient presents with  . Annual Exam    ? rectal itch , ?s      HPI: Patient  Toni Bradley  44 y.o. comes in today for Preventive Health Care visit   Dx with endometrial polyps to have removed irreg bleeding.  Thyroid   On low dose  ? If needs adjustment.  Rectal itching ongoin hx of eval ? Tag  What to use  Last rx was too expensive  No blood.   Hard to concentrate ? Could she have add   Poss traits in son no dx   Recent  past not as good as staying on task and " mutlitasking " quick switching .  No hx of same issues   Back ache when stis a lot at job  [facility] with her exercis  Health Maintenance  Topic Date Due  . INFLUENZA VACCINE  06/02/2016 (Originally 02/02/2015)  . TETANUS/TDAP  06/02/2016 (Originally 04/17/1991)  . HIV Screening  06/02/2016 (Originally 04/17/1987)  . PAP SMEAR  07/05/2015   Health Maintenance Review LIFESTYLE:  Exercise:   y says gaining weight frustrating for her   Tobacco/ETS:n Alcohol:  ocass  Sugar beverages:n Sleep: 6-7 hours  Drug use: no  ROS: back pain when sitting long periods no injury  GEN/ HEENT: No fever, significant weight changes sweats headaches vision problems hearing changes, CV/ PULM; No chest pain shortness of breath cough, syncope,edema  change in exercise tolerance. GI /GU: No adominal pain, vomiting, change in bowel habits. No blood in the stool. No significant GU symptoms. SKIN/HEME: ,no acute skin rashes suspicious lesions or bleeding. No lymphadenopathy, nodules, masses.  NEURO/ PSYCH:  No neurologic signs such as weakness numbness. No depression anxiety. IMM/ Allergy: No unusual infections.  Allergy .   REST of 12 system review negative except as per HPI   Past Medical History  Diagnosis Date  . Nasal polyps   . Mood disorder ([place])   .  Problems related to lack of adequate sleep   . Dry skin   . Recurrent HSV (herpes simplex virus)     Past Surgical History  Procedure Laterality Date  . Ovarian cyst sx  1996  . Childbirth  1998    vaginal  . Nasal polyp surgery       Family History  Problem Relation Age of Onset  . Diabetes Other   . Thyroid disease Other     Sister  . Other Father     Parathyroid    Social History   Social History  . Marital Status: Married    Spouse Name: N/A  . Number of Children: N/A  . Years of Education: N/A   Social History Main Topics  . Smoking status: Never Smoker   . Smokeless tobacco: Never Used  . Alcohol Use: Yes     Comment: occ.  . Drug Use: None  . Sexual Activity: Not Asked   Other Topics Concern  . 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    No ets.    Work out 5 x per week.    6-7 hours              Outpatient Prescriptions Prior  to Visit  Medication Sig Dispense Refill  . hydrocortisone (ANUSOL-HC) 2.5 % rectal cream Place 1 application rectally 2 (two) times daily. For 10 -14 days 28.35 g 0  . SYNTHROID 25 MCG tablet Take 1 tablet by mouth in the morning. 90 tablet 1  . valACYclovir (VALTREX) 500 MG tablet TAKE 1 TABLET TWICE A DAY 60 tablet 0  . SYNTHROID 25 MCG tablet TAKE 1 TABLET BY MOUTH IN THE MORNING. 30 tablet 1   No facility-administered medications prior to visit.     EXAM:  BP 96/66 mmHg  Temp(Src) 98.1 F (36.7 C) (Oral)  Ht _0  (1.575 m)  Wt 123 lb (55.792 kg)  BMI 22.49 kg/m2  Body mass index is 22.49 kg/(m^2).  Physical Exam: Vital signs reviewed CHE:NIDP is a well-developed well-nourished alert cooperative    who appearsr stated age in no acute distress.  HEENT: normocephalic atraumatic , Eyes: PERRL EOM's full, conjunctiva clear, Nares: paten,t no deformity discharge or tenderness., Ears: no deformity EAC's clear TMs with  normal landmarks. Mouth: clear OP, no lesions, edema.  Moist mucous membranes. Dentition in adequate repair. NECK: supple without masses,  or bruits. CHEST/PULM:  Clear to auscultation and percussion breath sounds equal no wheeze , rales or rhonchi. No chest wall deformities or tenderness. CV: PMI is nondisplaced, S1 S2 no gallops, murmurs, rubs. Peripheral pulses are full without delay.No JVD . Breast implants  ABDOMEN: Bowel sounds normal nontender  No guard or rebound, no hepato splenomegal no CVA tenderness.  No hernia. Extremtities:  No clubbing cyanosis or edema, no acute joint swelling or redness no focal atrophy Rectal area  Some excoriated  Pink thickened  Some tags but no lesion and no  Masses  NEURO:  Oriented x3, cranial nerves 3-12 appear to be intact, no obvious focal weakness,gait within normal limits no abnormal reflexes or asymmetrical  SKIN: No acute rashes normal turgor, color, no bruising or petechiae. PSYCH: Oriented, good eye contact, no obvious depression anxiety, cognition and judgment appear normal. LN: no cervical axillary inguinal adenopathy  Lab Results  Component Value Date   WBC 4.6 05/27/2015   HGB 14.2 05/27/2015   HCT 42.4 05/27/2015   PLT 248.0 05/27/2015   GLUCOSE 86 05/27/2015   CHOL 152 05/27/2015   TRIG 64.0 05/27/2015   HDL 72.80 05/27/2015   LDLCALC 66 05/27/2015   ALT 15 05/27/2015   AST 23 05/27/2015   NA 140 05/27/2015   K 4.0 05/27/2015   CL 102 05/27/2015   CREATININE 0.85 05/27/2015   BUN 10 05/27/2015   CO2 29 05/27/2015   TSH 3.52 05/27/2015    ASSESSMENT AND PLAN:  Discussed the following assessment and plan:  Visit for preventive health examination  Autoimmune thyroiditis - fu dr Darnell Level taking synthroid with vitamins ;take separate.   Anal pruritus - reveiwed try otc avoid reg  hcs   Attention or concentration problem  - newer onset dics manu factors and use lsi planning ocus on one thing at a time and get sleep fu if  progressive could eval psychologist also but will wait and se  Low back pain without sciatica, unspecified back pain laterality - sound mechanichal w/0o alarm sx   exercise measures fu if needed Multiple concern addressed today  fortunately most do not seem alarming .   Expectant management. fo fu  Had ? About hormone levels should disc with gyne  .   Could have some early menopausal sx adding     Patient  Care Team: Burnis Medin, MD as PCP - General (Internal Medicine) Louretta Shorten, MD as Consulting Physician (Obstetrics and Gynecology) Philemon Kingdom, MD as Consulting Physician (Internal Medicine) Patient Instructions   Take synthroid Without the vitamins .  By  Silvio Clayman plain  Or witch hazel. want to avoid routine hydrocortisone.   To avoid rebound .  Talk with gyne about the hormone   Problems .   Attend to back posture .    Avoid prolonged sitting .  Cross training for back pain .   Attention concentration  is effected by many things including sleep stress  , medications menopausal issues  Attend to doing one think at at time  To avoid rapid switching of tasks  Contact us if   You wish to do more evaluation  With psychology about concentration   Wt Readings from Last 3 Encounters:  06/03/15 123 lb (55.792 kg)  05/26/14 120 lb (54.432 kg)  03/31/14 120 lb 11.2 oz (54.749 kg)       Health Maintenance, Female Adopting a healthy lifestyle and getting preventive care can go a long way to promote health and wellness. Talk with your health care provider about what schedule of regular examinations is right for you. This is a good chance for you to check in with your provider about disease prevention and staying healthy. In between checkups, there are plenty of things you can do on your own. Experts have done a lot of research about which lifestyle changes and preventive measures are most likely to keep you healthy. Ask your health care provider for more information. WEIGHT AND  DIET  Eat a healthy diet  Be sure to include plenty of vegetables, fruits, low-fat dairy products, and lean protein.  Do not eat a lot of foods high in solid fats, added sugars, or salt.  Get regular exercise. This is one of the most important things you can do for your health.  Most adults should exercise for at least 150 minutes each week. The exercise should increase your heart rate and make you sweat (moderate-intensity exercise).  Most adults should also do strengthening exercises at least twice a week. This is in addition to the moderate-intensity exercise.  Maintain a healthy weight  Body mass index (BMI) is a measurement that can be used to identify possible weight problems. It estimates body fat based on height and weight. Your health care provider can help determine your BMI and help you achieve or maintain a healthy weight.  For females 33 years of age and older:   A BMI below 18.5 is considered underweight.  A BMI of 18.5 to 24.9 is normal.  A BMI of 25 to 29.9 is considered overweight.  A BMI of 30 and above is considered obese.  Watch levels of cholesterol and blood lipids  You should start having your blood tested for lipids and cholesterol at 43 years of age, then have this test every 5 years.  You may need to have your cholesterol levels checked more often if:  Your lipid or cholesterol levels are high.  You are older than 43 years of age.  You are at high risk for heart disease.  CANCER SCREENING   Lung Cancer  Lung cancer screening is recommended for adults 17-46 years old who are at high risk for lung cancer because of a history of smoking.  A yearly low-dose CT scan of the lungs is recommended for people who:  Currently smoke.  Have quit  within the past 15 years.  Have at least a 30-pack-year history of smoking. A pack year is smoking an average of one pack of cigarettes a day for 1 year.  Yearly screening should continue until it has been  15 years since you quit.  Yearly screening should stop if you develop a health problem that would prevent you from having lung cancer treatment.  Breast Cancer  Practice breast self-awareness. This means understanding how your breasts normally appear and feel.  It also means doing regular breast self-exams. Let your health care provider know about any changes, no matter how small.  If you are in your 20s or 30s, you should have a clinical breast exam (CBE) by a health care provider every 1-3 years as part of a regular health exam.  If you are 57 or older, have a CBE every year. Also consider having a breast X-ray (mammogram) every year.  If you have a family history of breast cancer, talk to your health care provider about genetic screening.  If you are at high risk for breast cancer, talk to your health care provider about having an MRI and a mammogram every year.  Breast cancer gene (BRCA) assessment is recommended for women who have family members with BRCA-related cancers. BRCA-related cancers include:  Breast.  Ovarian.  Tubal.  Peritoneal cancers.  Results of the assessment will determine the need for genetic counseling and BRCA1 and BRCA2 testing. Cervical Cancer Your health care provider may recommend that you be screened regularly for cancer of the pelvic organs (ovaries, uterus, and vagina). This screening involves a pelvic examination, including checking for microscopic changes to the surface of your cervix (Pap test). You may be encouraged to have this screening done every 3 years, beginning at age 73.  For women ages 24-65, health care providers may recommend pelvic exams and Pap testing every 3 years, or they may recommend the Pap and pelvic exam, combined with testing for human papilloma virus (HPV), every 5 years. Some types of HPV increase your risk of cervical cancer. Testing for HPV may also be done on women of any age with unclear Pap test results.  Other health  care providers may not recommend any screening for nonpregnant women who are considered low risk for pelvic cancer and who do not have symptoms. Ask your health care provider if a screening pelvic exam is right for you.  If you have had past treatment for cervical cancer or a condition that could lead to cancer, you need Pap tests and screening for cancer for at least 20 years after your treatment. If Pap tests have been discontinued, your risk factors (such as having a new sexual partner) need to be reassessed to determine if screening should resume. Some women have medical problems that increase the chance of getting cervical cancer. In these cases, your health care provider may recommend more frequent screening and Pap tests. Colorectal Cancer  This type of cancer can be detected and often prevented.  Routine colorectal cancer screening usually begins at 43 years of age and continues through 43 years of age.  Your health care provider may recommend screening at an earlier age if you have risk factors for colon cancer.  Your health care provider may also recommend using home test kits to check for hidden blood in the stool.  A small camera at the end of a tube can be used to examine your colon directly (sigmoidoscopy or colonoscopy). This is done to check for the  earliest forms of colorectal cancer.  Routine screening usually begins at age 69.  Direct examination of the colon should be repeated every 5-10 years through 43 years of age. However, you may need to be screened more often if early forms of precancerous polyps or small growths are found. Skin Cancer  Check your skin from head to toe regularly.  Tell your health care provider about any new moles or changes in moles, especially if there is a change in a mole's shape or color.  Also tell your health care provider if you have a mole that is larger than the size of a pencil eraser.  Always use sunscreen. Apply sunscreen liberally and  repeatedly throughout the day.  Protect yourself by wearing long sleeves, pants, a wide-brimmed hat, and sunglasses whenever you are outside. HEART DISEASE, DIABETES, AND HIGH BLOOD PRESSURE   High blood pressure causes heart disease and increases the risk of stroke. High blood pressure is more likely to develop in:  People who have blood pressure in the high end of the normal range (130-139/85-89 mm Hg).  People who are overweight or obese.  People who are African American.  If you are 64-55 years of age, have your blood pressure checked every 3-5 years. If you are 86 years of age or older, have your blood pressure checked every year. You should have your blood pressure measured twice--once when you are at a hospital or clinic, and once when you are not at a hospital or clinic. Record the average of the two measurements. To check your blood pressure when you are not at a hospital or clinic, you can use:  An automated blood pressure machine at a pharmacy.  A home blood pressure monitor.  If you are between 49 years and 70 years old, ask your health care provider if you should take aspirin to prevent strokes.  Have regular diabetes screenings. This involves taking a blood sample to check your fasting blood sugar level.  If you are at a normal weight and have a low risk for diabetes, have this test once every three years after 43 years of age.  If you are overweight and have a high risk for diabetes, consider being tested at a younger age or more often. PREVENTING INFECTION  Hepatitis B  If you have a higher risk for hepatitis B, you should be screened for this virus. You are considered at high risk for hepatitis B if:  You were born in a country where hepatitis B is common. Ask your health care provider which countries are considered high risk.  Your parents were born in a high-risk country, and you have not been immunized against hepatitis B (hepatitis B vaccine).  You have HIV or  AIDS.  You use needles to inject street drugs.  You live with someone who has hepatitis B.  You have had sex with someone who has hepatitis B.  You get hemodialysis treatment.  You take certain medicines for conditions, including cancer, organ transplantation, and autoimmune conditions. Hepatitis C  Blood testing is recommended for:  Everyone born from [birth year] through 1964.  Anyone with known risk factors for hepatitis C. Sexually transmitted infections (STIs)  You should be screened for sexually transmitted infections (STIs) including gonorrhea and chlamydia if:  You are sexually active and are younger than 43 years of age.  You are older than 43 years of age and your health care provider tells you that you are at risk for this type of infection.  Your sexual activity has changed since you were last screened and you are at an increased risk for chlamydia or gonorrhea. Ask your health care provider if you are at risk.  If you do not have HIV, but are at risk, it may be recommended that you take a prescription medicine daily to prevent HIV infection. This is called pre-exposure prophylaxis (PrEP). You are considered at risk if:  You are sexually active and do not regularly use condoms or know the HIV status of your partner(s).  You take drugs by injection.  You are sexually active with a partner who has HIV. Talk with your health care provider about whether you are at high risk of being infected with HIV. If you choose to begin PrEP, you should first be tested for HIV. You should then be tested every 3 months for as long as you are taking PrEP.  PREGNANCY   If you are premenopausal and you may become pregnant, ask your health care provider about preconception counseling.  If you may become pregnant, take 400 to 800 micrograms (mcg) of folic acid every day.  If you want to prevent pregnancy, talk to your health care provider about birth control (contraception). OSTEOPOROSIS AND  MENOPAUSE   Osteoporosis is a disease in which the bones lose minerals and strength with aging. This can result in serious bone fractures. Your risk for osteoporosis can be identified using a bone density scan.  If you are 24 years of age or older, or if you are at risk for osteoporosis and fractures, ask your health care provider if you should be screened.  Ask your health care provider whether you should take a calcium or vitamin D supplement to lower your risk for osteoporosis.  Menopause may have certain physical symptoms and risks.  Hormone replacement therapy may reduce some of these symptoms and risks. Talk to your health care provider about whether hormone replacement therapy is right for you.  HOME CARE INSTRUCTIONS   Schedule regular health, dental, and eye exams.  Stay current with your immunizations.   Do not use any tobacco products including cigarettes, chewing tobacco, or electronic cigarettes.  If you are pregnant, do not drink alcohol.  If you are breastfeeding, limit how much and how often you drink alcohol.  Limit alcohol intake to no more than 1 drink per day for nonpregnant women. One drink equals 12 ounces of beer, 5 ounces of wine, or 1 ounces of hard liquor.  Do not use street drugs.  Do not share needles.  Ask your health care provider for help if you need support or information about quitting drugs.  Tell your health care provider if you often feel depressed.  Tell your health care provider if you have ever been abused or do not feel safe at home.   This information is not intended to replace advice given to you by your health care provider. Make sure you discuss any questions you have with your health care provider.   Document Released: 01/03/2011 Document Revised: 07/11/2014 Document Reviewed: 05/22/2013 Elsevier Interactive Patient Education 2016 Hamilton K. [name] M.D.

## 2015-06-06 ENCOUNTER — Other Ambulatory Visit: Payer: Self-pay | Admitting: Internal Medicine

## 2015-06-12 ENCOUNTER — Telehealth: Payer: Self-pay | Admitting: Internal Medicine

## 2015-06-12 MED ORDER — SYNTHROID 25 MCG PO TABS: ORAL_TABLET | ORAL | Status: DC

## 2015-06-12 NOTE — Addendum Note (Signed)
Addended by: Rockie Neighbours B on: 06/12/2015 11:50 AM   Modules accepted: Orders

## 2015-06-12 NOTE — Telephone Encounter (Signed)
I reviewed her chart. No problems with TFTs. OK to refill Rx for 3 mo. Let's schedule her in Feb.

## 2015-06-12 NOTE — Telephone Encounter (Signed)
Rx refill sent to pt's pharmacy 

## 2015-06-12 NOTE — Telephone Encounter (Signed)
Please read message below and advise.  

## 2015-06-12 NOTE — Telephone Encounter (Signed)
Pt is scheduled for Dr. Arman Filter 1st available appt it is in February, the pt has her 30 day refill rx and it does state she needs to be seen before another refill can be administered. Please advise if there is a spot we could put her if you are will to work her into an already full day. Thank you!

## 2015-07-02 ENCOUNTER — Telehealth: Payer: Self-pay | Admitting: *Deleted

## 2015-07-02 ENCOUNTER — Encounter: Payer: Self-pay | Admitting: Internal Medicine

## 2015-07-02 NOTE — Telephone Encounter (Signed)
Message via MyChart:   tried to make an appointment as I have no refills left on Synthroid. There was nothing until February and the front desk was suppose to call me back after speaking with you.   I also wanted to share my recent results from physical with Dr. Regis Bill. I believe she was sending over as my TSH is now back high which is odd and a bit concerning... Please advise  thoughts and if there is anyway to get in sooner.   TSH- 4.13 Thyroxine (t4)- 9.4 T3 update -25 Free Thyroxine Index -2.4   This is the message I responded to patient:  Hi Toni Bradley,  Dr Cruzita Lederer is on vacation and will not return until Jan 3rd.  We had a cancellation and I scheduled you for Friday, Jan 6th at 10:15 am.  That was the best I could do.  We sent a refill of your Synthroid to [facility] on 12/9.  Please call them to see of they have it on hold.  I will forward your message to Dr Cruzita Lederer as well so that she is aware of your lab results.  Hope that you have a nice new years weekend.  We will see you next week.  [name], CMA   Be advised if she needs to make any dosage changes.  Thank you!

## 2015-07-06 NOTE — Telephone Encounter (Signed)
Sounds good, Thank you, [name]. I will d/w her then.

## 2015-07-10 ENCOUNTER — Encounter: Payer: Self-pay | Admitting: Internal Medicine

## 2015-07-10 ENCOUNTER — Ambulatory Visit (INDEPENDENT_AMBULATORY_CARE_PROVIDER_SITE_OTHER): Payer: BLUE CROSS/BLUE SHIELD | Admitting: Internal Medicine

## 2015-07-10 VITALS — BP 110/62 | HR 71 | Temp 97.7°F | Resp 12 | Wt 126.4 lb

## 2015-07-10 DIAGNOSIS — E063 Autoimmune thyroiditis: Secondary | ICD-10-CM

## 2015-07-10 LAB — T3, FREE: T3 FREE: 3.2 pg/mL (ref 2.3–4.2)

## 2015-07-10 LAB — TSH: TSH: 2.08 u[IU]/mL (ref 0.35–4.50)

## 2015-07-10 LAB — T4, FREE: FREE T4: 0.79 ng/dL (ref 0.60–1.60)

## 2015-07-10 NOTE — Patient Instructions (Signed)
Please stop at the lab.  Please return in 6 months. 

## 2015-07-10 NOTE — Progress Notes (Signed)
Patient ID: Toni Bradley, female   DOB: 18-May-1971, 45 y.o.   MRN: [id number]   HPI  Toni Bradley is a 45 y.o.-year-old female, returning for f/u for Hashimoto's thyroiditis. Last visit 05/2014. ObGyn: Dr Louretta Shorten.  Reviewed hx: Pt. has been dx with Hashimoto's thyroiditis in 2009; not on Levothyroxine, as her TFTs have always been normal. However, she c/o a series of sxs (see below) that she read online that are due to hypothyroidism. She saw an endocrinologist 2 years ago >> thyroid was full, but no other findings.  She is on Synthroid DAW 25 mcg daily: - fasting - with water - eats >30 min later - no Ca, Fe, PPI, MVI  I reviewed pt's thyroid tests: TSH- 4.13 Thyroxine (t4)- 9.4 T3 update -25 Free Thyroxine Index -2.4  Lab Results  Component Value Date   TSH 3.52 05/27/2015   TSH 2.66 01/16/2015   TSH 3.56 11/06/2014   TSH 2.50 08/18/2014   TSH 4.61* 07/01/2014   TSH 4.04 05/26/2014   TSH 1.06 03/31/2014   TSH 0.56 01/23/2014   TSH 4.19 08/19/2013   TSH 2.56 01/22/2013   FREET4 0.85 01/16/2015   FREET4 0.78 11/06/2014   FREET4 0.74 08/18/2014   FREET4 0.73 07/01/2014   FREET4 0.79 05/26/2014   FREET4 0.80 03/31/2014   FREET4 0.93 01/23/2014   FREET4 0.76 01/22/2013   FREET4 0.85 11/23/2012   FREET4 0.57* 01/16/2012  Anti-TPO Abs were 310 in 2009 and 40 (<9) in 05/2014.  Pt denies feeling nodules in neck, hoarseness, dysphagia/odynophagia, SOB with lying down.  Pt describes: - no cold intolerance - + weight gain despite lifting weights, running, and eating "clean" - no fatigue - no constipation - + dry skin - no hair loss - regular menses now, but + cramps.  She had a high FSH (140) in the last year, ?early menopause.  I reviewed pt's medications, allergies, PMH, social hx, family hx, and changes were documented in the history of present illness. Otherwise, unchanged from my initial visit note.  ROS: Constitutional: see HPI,  Eyes: no blurry  vision, no xerophthalmia ENT: + sore throat, no nodules palpated in throat, no dysphagia/odynophagia, no hoarseness Cardiovascular: no CP/SOB/palpitations/leg swelling Respiratory: no cough/SOB Gastrointestinal: no N/V/D/C Musculoskeletal: no muscle/joint aches Skin: no rashes, no hair loss Neurological: no tremors/numbness/tingling/dizziness  PE: BP 110/62 mmHg  Pulse 71  Temp(Src) 97.7 F (36.5 C) (Oral)  Resp 12  Wt 126 lb 6.4 oz (57.335 kg)  SpO2 98% Wt Readings from Last 3 Encounters:  07/10/15 126 lb 6.4 oz (57.335 kg)  06/03/15 123 lb (55.792 kg)  05/26/14 120 lb (54.432 kg)   Constitutional: normal weight, fit, in NAD Eyes: PERRLA, EOMI, no exophthalmos ENT: moist mucous membranes, no thyromegaly - full, no cervical lymphadenopathy Cardiovascular: RRR, No MRG Respiratory: CTA B Gastrointestinal: abdomen soft, NT, ND, BS+ Musculoskeletal: no deformities, strength intact in all 4 Skin: moist, warm, no rashes Neurological: no tremor with outstretched hands, DTR normal in all 4  ASSESSMENT: 1. 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 Patient with long-standing Hashimoto's ds, on low dose Synthroid. She appears euthyroid but again complains of weight gain despite eating healthy and exercising. - She does not appear to have a goiter, thyroid nodules, or neck compression symptoms - we'll check thyroid tests today: TSH, free T4, free T3  - If these are abnormal, she will need to return in 6-8 weeks for repeat labs - If these are normal, I will  see her back in 6 months  Needs refills when labs are back. Office Visit on 07/10/2015  Component Date Value Ref Range Status  . TSH 07/10/2015 2.08  0.35 - 4.50 uIU/mL Final  . T3, Free 07/10/2015 3.2  2.3 - 4.2 pg/mL Final  . Free T4 07/10/2015 0.79  0.60 - 1.60 ng/dL Final

## 2015-07-13 ENCOUNTER — Encounter: Payer: Self-pay | Admitting: Internal Medicine

## 2015-07-13 DIAGNOSIS — K629 Disease of anus and rectum, unspecified: Secondary | ICD-10-CM

## 2015-07-14 NOTE — Telephone Encounter (Signed)
Please proceed to  Gi referral .

## 2015-07-16 MED ORDER — HYDROCORTISONE 2.5 % RE CREA: 1.0000 "application " | TOPICAL_CREAM | Freq: Two times a day (BID) | RECTAL | Status: DC

## 2015-07-16 NOTE — Addendum Note (Signed)
Addended by: Miles Costain T on: 07/16/2015 02:19 PM   Modules accepted: Orders

## 2015-07-16 NOTE — Telephone Encounter (Signed)
Uncertain what will  Work best but can use short term anuso hc.

## 2015-07-16 NOTE — Telephone Encounter (Signed)
Sent to the pharmacy by e-scribe. 

## 2015-07-16 NOTE — Addendum Note (Signed)
Addended byShanon Ace K on: 07/16/2015 01:06 PM   Modules accepted: Orders

## 2015-07-17 ENCOUNTER — Other Ambulatory Visit: Payer: Self-pay | Admitting: Internal Medicine

## 2015-07-20 ENCOUNTER — Other Ambulatory Visit: Payer: Self-pay

## 2015-07-20 MED ORDER — LEVOTHYROXINE SODIUM 25 MCG PO TABS: ORAL_TABLET | ORAL | Status: DC

## 2015-08-07 ENCOUNTER — Ambulatory Visit: Payer: BLUE CROSS/BLUE SHIELD | Admitting: Internal Medicine

## 2015-08-14 ENCOUNTER — Encounter: Payer: Self-pay | Admitting: Gastroenterology

## 2015-10-02 ENCOUNTER — Ambulatory Visit: Payer: BLUE CROSS/BLUE SHIELD | Admitting: Gastroenterology

## 2015-10-05 DIAGNOSIS — M545 Low back pain: Secondary | ICD-10-CM | POA: Diagnosis not present

## 2015-10-05 DIAGNOSIS — M791 Myalgia: Secondary | ICD-10-CM | POA: Diagnosis not present

## 2015-10-05 DIAGNOSIS — M5136 Other intervertebral disc degeneration, lumbar region: Secondary | ICD-10-CM | POA: Diagnosis not present

## 2015-10-05 DIAGNOSIS — M5127 Other intervertebral disc displacement, lumbosacral region: Secondary | ICD-10-CM | POA: Diagnosis not present

## 2015-10-05 DIAGNOSIS — M461 Sacroiliitis, not elsewhere classified: Secondary | ICD-10-CM | POA: Diagnosis not present

## 2015-10-08 DIAGNOSIS — M791 Myalgia: Secondary | ICD-10-CM | POA: Diagnosis not present

## 2015-10-08 DIAGNOSIS — M5127 Other intervertebral disc displacement, lumbosacral region: Secondary | ICD-10-CM | POA: Diagnosis not present

## 2015-10-08 DIAGNOSIS — M545 Low back pain: Secondary | ICD-10-CM | POA: Diagnosis not present

## 2015-10-08 DIAGNOSIS — M461 Sacroiliitis, not elsewhere classified: Secondary | ICD-10-CM | POA: Diagnosis not present

## 2015-10-08 DIAGNOSIS — M5136 Other intervertebral disc degeneration, lumbar region: Secondary | ICD-10-CM | POA: Diagnosis not present

## 2015-10-12 DIAGNOSIS — M5127 Other intervertebral disc displacement, lumbosacral region: Secondary | ICD-10-CM | POA: Diagnosis not present

## 2015-10-12 DIAGNOSIS — M791 Myalgia: Secondary | ICD-10-CM | POA: Diagnosis not present

## 2015-10-12 DIAGNOSIS — M5136 Other intervertebral disc degeneration, lumbar region: Secondary | ICD-10-CM | POA: Diagnosis not present

## 2015-10-12 DIAGNOSIS — M545 Low back pain: Secondary | ICD-10-CM | POA: Diagnosis not present

## 2015-10-12 DIAGNOSIS — M461 Sacroiliitis, not elsewhere classified: Secondary | ICD-10-CM | POA: Diagnosis not present

## 2015-10-14 ENCOUNTER — Other Ambulatory Visit: Payer: Self-pay | Admitting: Internal Medicine

## 2015-10-19 DIAGNOSIS — M461 Sacroiliitis, not elsewhere classified: Secondary | ICD-10-CM | POA: Diagnosis not present

## 2015-10-19 DIAGNOSIS — M545 Low back pain: Secondary | ICD-10-CM | POA: Diagnosis not present

## 2015-10-19 DIAGNOSIS — M5126 Other intervertebral disc displacement, lumbar region: Secondary | ICD-10-CM | POA: Diagnosis not present

## 2015-10-19 DIAGNOSIS — M791 Myalgia: Secondary | ICD-10-CM | POA: Diagnosis not present

## 2015-10-19 DIAGNOSIS — M47816 Spondylosis without myelopathy or radiculopathy, lumbar region: Secondary | ICD-10-CM | POA: Diagnosis not present

## 2015-10-22 DIAGNOSIS — M5126 Other intervertebral disc displacement, lumbar region: Secondary | ICD-10-CM | POA: Diagnosis not present

## 2015-10-22 DIAGNOSIS — M461 Sacroiliitis, not elsewhere classified: Secondary | ICD-10-CM | POA: Diagnosis not present

## 2015-10-22 DIAGNOSIS — M791 Myalgia: Secondary | ICD-10-CM | POA: Diagnosis not present

## 2015-10-22 DIAGNOSIS — M47816 Spondylosis without myelopathy or radiculopathy, lumbar region: Secondary | ICD-10-CM | POA: Diagnosis not present

## 2015-10-26 DIAGNOSIS — M545 Low back pain: Secondary | ICD-10-CM | POA: Diagnosis not present

## 2015-10-26 DIAGNOSIS — M461 Sacroiliitis, not elsewhere classified: Secondary | ICD-10-CM | POA: Diagnosis not present

## 2015-10-26 DIAGNOSIS — M791 Myalgia: Secondary | ICD-10-CM | POA: Diagnosis not present

## 2015-10-26 DIAGNOSIS — M47816 Spondylosis without myelopathy or radiculopathy, lumbar region: Secondary | ICD-10-CM | POA: Diagnosis not present

## 2015-10-26 DIAGNOSIS — M5126 Other intervertebral disc displacement, lumbar region: Secondary | ICD-10-CM | POA: Diagnosis not present

## 2015-10-29 DIAGNOSIS — M791 Myalgia: Secondary | ICD-10-CM | POA: Diagnosis not present

## 2015-10-29 DIAGNOSIS — M545 Low back pain: Secondary | ICD-10-CM | POA: Diagnosis not present

## 2015-10-29 DIAGNOSIS — M47816 Spondylosis without myelopathy or radiculopathy, lumbar region: Secondary | ICD-10-CM | POA: Diagnosis not present

## 2015-10-29 DIAGNOSIS — M5126 Other intervertebral disc displacement, lumbar region: Secondary | ICD-10-CM | POA: Diagnosis not present

## 2015-11-02 DIAGNOSIS — M461 Sacroiliitis, not elsewhere classified: Secondary | ICD-10-CM | POA: Diagnosis not present

## 2015-11-02 DIAGNOSIS — M47816 Spondylosis without myelopathy or radiculopathy, lumbar region: Secondary | ICD-10-CM | POA: Diagnosis not present

## 2015-11-02 DIAGNOSIS — M5126 Other intervertebral disc displacement, lumbar region: Secondary | ICD-10-CM | POA: Diagnosis not present

## 2015-11-02 DIAGNOSIS — M791 Myalgia: Secondary | ICD-10-CM | POA: Diagnosis not present

## 2015-11-05 DIAGNOSIS — M791 Myalgia: Secondary | ICD-10-CM | POA: Diagnosis not present

## 2015-11-05 DIAGNOSIS — M47816 Spondylosis without myelopathy or radiculopathy, lumbar region: Secondary | ICD-10-CM | POA: Diagnosis not present

## 2015-11-05 DIAGNOSIS — M5126 Other intervertebral disc displacement, lumbar region: Secondary | ICD-10-CM | POA: Diagnosis not present

## 2015-11-05 DIAGNOSIS — M461 Sacroiliitis, not elsewhere classified: Secondary | ICD-10-CM | POA: Diagnosis not present

## 2015-11-09 DIAGNOSIS — M5126 Other intervertebral disc displacement, lumbar region: Secondary | ICD-10-CM | POA: Diagnosis not present

## 2015-11-09 DIAGNOSIS — M461 Sacroiliitis, not elsewhere classified: Secondary | ICD-10-CM | POA: Diagnosis not present

## 2015-11-09 DIAGNOSIS — M47816 Spondylosis without myelopathy or radiculopathy, lumbar region: Secondary | ICD-10-CM | POA: Diagnosis not present

## 2015-11-09 DIAGNOSIS — M791 Myalgia: Secondary | ICD-10-CM | POA: Diagnosis not present

## 2015-11-12 DIAGNOSIS — M5126 Other intervertebral disc displacement, lumbar region: Secondary | ICD-10-CM | POA: Diagnosis not present

## 2015-11-12 DIAGNOSIS — M461 Sacroiliitis, not elsewhere classified: Secondary | ICD-10-CM | POA: Diagnosis not present

## 2015-11-12 DIAGNOSIS — M47816 Spondylosis without myelopathy or radiculopathy, lumbar region: Secondary | ICD-10-CM | POA: Diagnosis not present

## 2015-11-12 DIAGNOSIS — M791 Myalgia: Secondary | ICD-10-CM | POA: Diagnosis not present

## 2015-11-16 DIAGNOSIS — M461 Sacroiliitis, not elsewhere classified: Secondary | ICD-10-CM | POA: Diagnosis not present

## 2015-11-16 DIAGNOSIS — M791 Myalgia: Secondary | ICD-10-CM | POA: Diagnosis not present

## 2015-11-16 DIAGNOSIS — M5126 Other intervertebral disc displacement, lumbar region: Secondary | ICD-10-CM | POA: Diagnosis not present

## 2015-11-16 DIAGNOSIS — M47816 Spondylosis without myelopathy or radiculopathy, lumbar region: Secondary | ICD-10-CM | POA: Diagnosis not present

## 2015-11-19 DIAGNOSIS — M791 Myalgia: Secondary | ICD-10-CM | POA: Diagnosis not present

## 2015-11-19 DIAGNOSIS — M47816 Spondylosis without myelopathy or radiculopathy, lumbar region: Secondary | ICD-10-CM | POA: Diagnosis not present

## 2015-11-19 DIAGNOSIS — M5126 Other intervertebral disc displacement, lumbar region: Secondary | ICD-10-CM | POA: Diagnosis not present

## 2015-11-19 DIAGNOSIS — M461 Sacroiliitis, not elsewhere classified: Secondary | ICD-10-CM | POA: Diagnosis not present

## 2015-11-22 ENCOUNTER — Other Ambulatory Visit: Payer: Self-pay | Admitting: Internal Medicine

## 2015-11-23 DIAGNOSIS — M461 Sacroiliitis, not elsewhere classified: Secondary | ICD-10-CM | POA: Diagnosis not present

## 2015-11-23 DIAGNOSIS — M5126 Other intervertebral disc displacement, lumbar region: Secondary | ICD-10-CM | POA: Diagnosis not present

## 2015-11-23 DIAGNOSIS — M47816 Spondylosis without myelopathy or radiculopathy, lumbar region: Secondary | ICD-10-CM | POA: Diagnosis not present

## 2015-11-23 DIAGNOSIS — M791 Myalgia: Secondary | ICD-10-CM | POA: Diagnosis not present

## 2015-11-26 DIAGNOSIS — M791 Myalgia: Secondary | ICD-10-CM | POA: Diagnosis not present

## 2015-11-26 DIAGNOSIS — M5126 Other intervertebral disc displacement, lumbar region: Secondary | ICD-10-CM | POA: Diagnosis not present

## 2015-11-26 DIAGNOSIS — M461 Sacroiliitis, not elsewhere classified: Secondary | ICD-10-CM | POA: Diagnosis not present

## 2015-11-26 DIAGNOSIS — M47816 Spondylosis without myelopathy or radiculopathy, lumbar region: Secondary | ICD-10-CM | POA: Diagnosis not present

## 2016-01-12 DIAGNOSIS — E559 Vitamin D deficiency, unspecified: Secondary | ICD-10-CM | POA: Diagnosis not present

## 2016-01-12 DIAGNOSIS — E063 Autoimmune thyroiditis: Secondary | ICD-10-CM | POA: Diagnosis not present

## 2016-01-12 DIAGNOSIS — N959 Unspecified menopausal and perimenopausal disorder: Secondary | ICD-10-CM | POA: Diagnosis not present

## 2016-01-12 DIAGNOSIS — E039 Hypothyroidism, unspecified: Secondary | ICD-10-CM | POA: Diagnosis not present

## 2016-01-14 ENCOUNTER — Ambulatory Visit: Payer: BLUE CROSS/BLUE SHIELD | Admitting: Internal Medicine

## 2016-01-18 ENCOUNTER — Ambulatory Visit (INDEPENDENT_AMBULATORY_CARE_PROVIDER_SITE_OTHER): Payer: BLUE CROSS/BLUE SHIELD | Admitting: Internal Medicine

## 2016-01-18 ENCOUNTER — Encounter: Payer: Self-pay | Admitting: Internal Medicine

## 2016-01-18 VITALS — BP 110/80 | HR 79 | Ht 62.5 in | Wt 129.0 lb

## 2016-01-18 DIAGNOSIS — R635 Abnormal weight gain: Secondary | ICD-10-CM | POA: Diagnosis not present

## 2016-01-18 DIAGNOSIS — E038 Other specified hypothyroidism: Secondary | ICD-10-CM

## 2016-01-18 DIAGNOSIS — E063 Autoimmune thyroiditis: Secondary | ICD-10-CM

## 2016-01-18 NOTE — Patient Instructions (Signed)
Please come back for a follow-up appointment in 6 months.  Please continue Synthroid 25 mcg daily.  Take the thyroid hormone every day, with water, at least 30 minutes before breakfast, separated by at least 4 hours from: - acid reflux medications - calcium - iron - multivitamins

## 2016-01-18 NOTE — Progress Notes (Signed)
Patient ID: Toni Bradley, female   DOB: Jun 21, 1971, 45 y.o.   MRN: [id number]   HPI  Toni Bradley is a 45 y.o.-year-old female, returning for f/u for hypothyroidism 2/2 Hashimoto's thyroiditis. Last visit 6 mo ago. ObGyn: Dr Louretta Shorten.  Reviewed hx: Pt. has been dx with Hashimoto's thyroiditis in 2009; not on Levothyroxine, as her TFTs have always been normal. However, she c/o a series of sxs (see below) that she read online that are due to hypothyroidism. She saw an endocrinologist 2 years ago >> thyroid was full, but no other findings.  She is on Synthroid DAW 25 mcg daily: - fasting - with water - eats >30 min later - no Ca, Fe, PPI, MVI  I reviewed pt's thyroid tests: Lab Results  Component Value Date   TSH 2.08 07/10/2015   TSH 3.52 05/27/2015   TSH 2.66 01/16/2015   TSH 3.56 11/06/2014   TSH 2.50 08/18/2014   TSH 4.61* 07/01/2014   TSH 4.04 05/26/2014   TSH 1.06 03/31/2014   TSH 0.56 01/23/2014   TSH 4.19 08/19/2013   FREET4 0.79 07/10/2015   FREET4 0.85 01/16/2015   FREET4 0.78 11/06/2014   FREET4 0.74 08/18/2014   FREET4 0.73 07/01/2014   FREET4 0.79 05/26/2014   FREET4 0.80 03/31/2014   FREET4 0.93 01/23/2014   FREET4 0.76 01/22/2013   FREET4 0.85 11/23/2012  Anti-TPO Abs were 310 in 2009 and 40 (<9) in 05/2014.  AntiTPO Abs (01/12/2016): 27  Pt denies feeling nodules in neck, hoarseness, dysphagia/odynophagia, SOB with lying down.  Pt describes: - no cold intolerance - + weight gain despite lifting weights, running, and eating "clean" - no fatigue - no constipation - + dry skin - + hair thinning - regular menses She had high FSH levels in 2015 (140 >> 98 >> 93) in 2015 >> she is on HRT.  I reviewed pt's medications, allergies, PMH, social hx, family hx, and changes were documented in the history of present illness. Otherwise, unchanged from my initial visit note.  ROS: Constitutional: see HPI,  Eyes: no blurry vision, no xerophthalmia ENT:  no sore throat, no nodules palpated in throat, no dysphagia/odynophagia, no hoarseness Cardiovascular: no CP/SOB/palpitations/leg swelling Respiratory: no cough/SOB Gastrointestinal: no N/V/D/C Musculoskeletal: no muscle/joint aches Skin: no rashes, no hair loss Neurological: no tremors/numbness/tingling/dizziness  PE: BP 110/80 mmHg  Pulse 79  Ht 5' 2.5" (1.588 m)  Wt 129 lb (58.514 kg)  BMI 23.20 kg/m2  SpO2 98%  LMP 12/18/2015 Wt Readings from Last 3 Encounters:  01/18/16 129 lb (58.514 kg)  07/10/15 126 lb 6.4 oz (57.335 kg)  06/03/15 123 lb (55.792 kg)   Constitutional: normal weight, fit, in NAD Eyes: PERRLA, EOMI, no exophthalmos ENT: moist mucous membranes, full thyroid, no cervical lymphadenopathy Cardiovascular: RRR, No MRG Respiratory: CTA B Gastrointestinal: abdomen soft, NT, ND, BS+ Musculoskeletal: no deformities, strength intact in all 4 Skin: moist, warm, no rashes Neurological: no tremor with outstretched hands, DTR normal in all 4  ASSESSMENT: 1. Hypothyroidism 2/2 Hashimoto's thyroiditis  2. Weight gain  PLAN:  1. Patient with long-standing Hashimoto's ds, on low dose Synthroid. She appears euthyroid but again complains of weight gain despite eating healthy and exercising. We decided to try to raise the LT4 to 37.5 mcg daily if TSH is still > 2.5. She had labs at Dr Gregor Hams office last week >> We obtained records, but no TSH available for review.  - She does not appear to have a goiter, thyroid nodules,  or neck compression symptoms  2. Weight gain - we again discussed at length about this - she feels she exercises enough that she should not gain weight. She does not think she can improve her diet more.  - discussed that gaining muscle may be the culprit - she is not weighing at home >> advised to do so. - I will see her back in 6 months  Component     Latest Ref Rng & Units 01/29/2016  T4,Free(Direct)     0.60 - 1.60 ng/dL 0.77  TSH     0.35 - 4.50  uIU/mL 2.94   Per our discussion, we can try to raise the levothyroxine to 37.5 g daily and have her back for labs in 2 months.  Philemon Kingdom, MD PhD [facility] Endocrinology

## 2016-01-22 ENCOUNTER — Other Ambulatory Visit: Payer: Self-pay | Admitting: Internal Medicine

## 2016-01-22 ENCOUNTER — Encounter: Payer: Self-pay | Admitting: Internal Medicine

## 2016-01-22 DIAGNOSIS — E063 Autoimmune thyroiditis: Principal | ICD-10-CM

## 2016-01-22 DIAGNOSIS — E038 Other specified hypothyroidism: Secondary | ICD-10-CM

## 2016-01-27 ENCOUNTER — Encounter: Payer: Self-pay | Admitting: Internal Medicine

## 2016-01-27 NOTE — Progress Notes (Signed)
Received labs from Dr. Corinna Capra, drawn on 01/12/2016: - Free T4 1.1 (0.8-1.8), free T3 2.6 (2.3-4.2), no TSH to review - Estradiol 22, progesterone 4.7, free testosterone 9.6 (0.1-6.4), DHEAS 188 (19-231) - TPO antibodies 27 (0-9), ATA antibodies <1 (<2)

## 2016-01-29 ENCOUNTER — Other Ambulatory Visit (INDEPENDENT_AMBULATORY_CARE_PROVIDER_SITE_OTHER): Payer: BLUE CROSS/BLUE SHIELD

## 2016-01-29 DIAGNOSIS — E038 Other specified hypothyroidism: Secondary | ICD-10-CM

## 2016-01-29 DIAGNOSIS — E063 Autoimmune thyroiditis: Secondary | ICD-10-CM | POA: Diagnosis not present

## 2016-01-29 LAB — TSH: TSH: 2.94 u[IU]/mL (ref 0.35–4.50)

## 2016-01-29 LAB — T4, FREE: FREE T4: 0.77 ng/dL (ref 0.60–1.60)

## 2016-01-29 MED ORDER — SYNTHROID 25 MCG PO TABS: 37.5000 ug | ORAL_TABLET | Freq: Every day | ORAL | 1 refills | Status: DC

## 2016-02-22 DIAGNOSIS — R1031 Right lower quadrant pain: Secondary | ICD-10-CM | POA: Diagnosis not present

## 2016-03-08 DIAGNOSIS — E509 Vitamin A deficiency, unspecified: Secondary | ICD-10-CM | POA: Diagnosis not present

## 2016-03-08 DIAGNOSIS — E279 Disorder of adrenal gland, unspecified: Secondary | ICD-10-CM | POA: Diagnosis not present

## 2016-03-08 DIAGNOSIS — E559 Vitamin D deficiency, unspecified: Secondary | ICD-10-CM | POA: Diagnosis not present

## 2016-03-08 DIAGNOSIS — E039 Hypothyroidism, unspecified: Secondary | ICD-10-CM | POA: Diagnosis not present

## 2016-03-23 DIAGNOSIS — E509 Vitamin A deficiency, unspecified: Secondary | ICD-10-CM | POA: Diagnosis not present

## 2016-03-23 DIAGNOSIS — E28310 Symptomatic premature menopause: Secondary | ICD-10-CM | POA: Diagnosis not present

## 2016-03-23 DIAGNOSIS — E063 Autoimmune thyroiditis: Secondary | ICD-10-CM | POA: Diagnosis not present

## 2016-03-23 DIAGNOSIS — E559 Vitamin D deficiency, unspecified: Secondary | ICD-10-CM | POA: Diagnosis not present

## 2016-05-10 DIAGNOSIS — N959 Unspecified menopausal and perimenopausal disorder: Secondary | ICD-10-CM | POA: Diagnosis not present

## 2016-05-10 DIAGNOSIS — E538 Deficiency of other specified B group vitamins: Secondary | ICD-10-CM | POA: Diagnosis not present

## 2016-05-10 DIAGNOSIS — R5383 Other fatigue: Secondary | ICD-10-CM | POA: Diagnosis not present

## 2016-05-10 DIAGNOSIS — E039 Hypothyroidism, unspecified: Secondary | ICD-10-CM | POA: Diagnosis not present

## 2016-06-23 DIAGNOSIS — N959 Unspecified menopausal and perimenopausal disorder: Secondary | ICD-10-CM | POA: Diagnosis not present

## 2016-06-23 DIAGNOSIS — E039 Hypothyroidism, unspecified: Secondary | ICD-10-CM | POA: Diagnosis not present

## 2016-07-06 DIAGNOSIS — J069 Acute upper respiratory infection, unspecified: Secondary | ICD-10-CM | POA: Diagnosis not present

## 2016-07-06 DIAGNOSIS — B9789 Other viral agents as the cause of diseases classified elsewhere: Secondary | ICD-10-CM | POA: Diagnosis not present

## 2016-07-20 ENCOUNTER — Ambulatory Visit: Payer: BLUE CROSS/BLUE SHIELD | Admitting: Internal Medicine

## 2016-07-25 ENCOUNTER — Encounter: Payer: Self-pay | Admitting: Internal Medicine

## 2016-07-25 ENCOUNTER — Ambulatory Visit (INDEPENDENT_AMBULATORY_CARE_PROVIDER_SITE_OTHER): Payer: BLUE CROSS/BLUE SHIELD | Admitting: Internal Medicine

## 2016-07-25 VITALS — BP 118/82 | HR 78 | Wt 127.0 lb

## 2016-07-25 DIAGNOSIS — E038 Other specified hypothyroidism: Secondary | ICD-10-CM | POA: Diagnosis not present

## 2016-07-25 DIAGNOSIS — E063 Autoimmune thyroiditis: Secondary | ICD-10-CM | POA: Diagnosis not present

## 2016-07-25 NOTE — Patient Instructions (Addendum)
Please decrease T3 to 1/2 a tablet daily for 1 week, then stop.  Please increase the Synthroid to 37.5 mcg daily and move it to am.  Take the thyroid hormone every day, with water, at least 30 minutes before breakfast, separated by at least 4 hours from: - acid reflux medications - calcium - iron - multivitamins  Please come back for labs in 6 weeks.  Please come back for a follow-up appointment in 6 months.

## 2016-07-25 NOTE — Progress Notes (Signed)
Patient ID: Toni Bradley, female   DOB: 03/30/1971, 46 y.o.   MRN: [id number]   HPI  Toni Bradley is a 46 y.o.-year-old female, returning for f/u for hypothyroidism 2/2 Hashimoto's thyroiditis. Last visit 6 mo ago. ObGyn: Dr Louretta Shorten.  Reviewed hx: Pt. has been dx with Hashimoto's thyroiditis in 2009; not on Levothyroxine, as her TFTs have always been normal. However, she c/o a series of sxs (see below) that she read online that are due to hypothyroidism. She saw an endocrinologist 2 years ago >> thyroid was full, but no other findings.  Since last visit, she started Cytomel 5 mg daily ~2-3 mo ago by an integrative medicine practice (they have gone out of business since).  She is on Synthroid DAW 25 mcg daily: - fasting >> moved this at night - with water - eats >30 min later - drinks coffee + almond milk - no Ca, Fe, PPI - + MVI in am  I reviewed pt's thyroid tests: 06/23/2016: TSH 2.45 Lab Results  Component Value Date   TSH 2.94 01/29/2016   TSH 2.08 07/10/2015   TSH 3.52 05/27/2015   TSH 2.66 01/16/2015   TSH 3.56 11/06/2014   TSH 2.50 08/18/2014   TSH 4.61 (H) 07/01/2014   TSH 4.04 05/26/2014   TSH 1.06 03/31/2014   TSH 0.56 01/23/2014   FREET4 0.77 01/29/2016   FREET4 0.79 07/10/2015   FREET4 0.85 01/16/2015   FREET4 0.78 11/06/2014   FREET4 0.74 08/18/2014   FREET4 0.73 07/01/2014   FREET4 0.79 05/26/2014   FREET4 0.80 03/31/2014   FREET4 0.93 01/23/2014   FREET4 0.76 01/22/2013  Anti-TPO Abs were 310 in 2009 and 40 (<9) in 05/2014. Anti-TPO Abs (01/12/2016): 27  Received labs from Dr. Corinna Capra, drawn on 01/12/2016: - Free T4 1.1 (0.8-1.8), free T3 2.6 (2.3-4.2), no TSH to review - Estradiol 22, progesterone 4.7, free testosterone 9.6 (0.1-6.4), DHEAS 188 (19-231) - TPO antibodies 27 (0-9), ATA antibodies <1 (<2)  Pt denies feeling nodules in neck, hoarseness, dysphagia/odynophagia, SOB with lying down.  Pt describes: - no cold intolerance - +  weight gain - lifting weights, running, and eating "clean" - no fatigue - no constipation - + dry skin - + hair thinning, loss - regular menses She had high FSH levels in 2015 (140 >> 98 >> 93) in 2015 >> she is on HRT.  I reviewed pt's medications, allergies, PMH, social hx, family hx, and changes were documented in the history of present illness. Otherwise, unchanged from my initial visit note.  ROS: Constitutional: see HPI,  Eyes: no blurry vision, no xerophthalmia ENT: no sore throat, no nodules palpated in throat, no dysphagia/odynophagia, no hoarseness Cardiovascular: no CP/SOB/palpitations/leg swelling Respiratory: no cough/SOB Gastrointestinal: no N/V/D/C Musculoskeletal: no muscle/joint aches Skin: no rashes, + hair loss, + dry skin Neurological: no tremors/numbness/tingling/dizziness  PE: BP 118/82 (BP Location: Left Arm, Patient Position: Sitting)   Pulse 78   Wt 127 lb (57.6 kg)   SpO2 98%   BMI 22.86 kg/m  Wt Readings from Last 3 Encounters:  07/25/16 127 lb (57.6 kg)  01/18/16 129 lb (58.5 kg)  07/10/15 126 lb 6.4 oz (57.3 kg)   Constitutional: normal weight, fit, in NAD Eyes: PERRLA, EOMI, no exophthalmos ENT: moist mucous membranes, full thyroid, no cervical lymphadenopathy Cardiovascular: RRR, No MRG Respiratory: CTA B Gastrointestinal: abdomen soft, NT, ND, BS+ Musculoskeletal: no deformities, strength intact in all 4 Skin: moist, warm, no rashes Neurological: no tremor with outstretched hands,  DTR normal in all 4  ASSESSMENT: 1. Hypothyroidism 2/2 Hashimoto's thyroiditis  2. Weight gain  PLAN:  1. Patient with long-standing Hashimoto's ds, on low dose Synthroid (She did not increase the dose to 37.5 g as advised, but added Cytomel since last visit. We discussed that she does not need the Cytomel at this point, and especially at the 1.5:1 LT4:LT3 ratio (normal ratio is approximately 12:1). She appears euthyroid but again complains of weight gain  despite eating healthy and exercising - per our scale she lost 2 pounds since last visit.  - Will increase  LT4 to 37.5 mcg daily, taper down and then stop Cytomel and have her back for labs in 6 weeks. - I also strongly advised her to move levothyroxine the morning. Again advised her to take the thyroid hormone every day, with water, at least 30 minutes before breakfast, separated by at least 4 hours from: - acid reflux medications - calcium - iron - multivitamins  2. Weight gain - I assured her that this is not due to her thyroid -  in fact, she lost weight per our scale despite heavy clothes  Philemon Kingdom, MD PhD [facility] Endocrinology

## 2016-07-26 ENCOUNTER — Encounter: Payer: Self-pay | Admitting: Internal Medicine

## 2016-07-27 NOTE — Telephone Encounter (Signed)
Ok to refer to dr Chalmers Cater   Unless she has a nother in mind  [facility]  [facility]  Thanks [facility]

## 2016-08-05 ENCOUNTER — Encounter: Payer: Self-pay | Admitting: Internal Medicine

## 2016-08-05 ENCOUNTER — Ambulatory Visit (INDEPENDENT_AMBULATORY_CARE_PROVIDER_SITE_OTHER): Payer: BLUE CROSS/BLUE SHIELD | Admitting: Internal Medicine

## 2016-08-05 VITALS — BP 96/62 | HR 77 | Temp 98.2°F | Wt 127.0 lb

## 2016-08-05 DIAGNOSIS — E063 Autoimmune thyroiditis: Secondary | ICD-10-CM

## 2016-08-05 DIAGNOSIS — R0789 Other chest pain: Secondary | ICD-10-CM

## 2016-08-05 DIAGNOSIS — R059 Cough, unspecified: Secondary | ICD-10-CM

## 2016-08-05 DIAGNOSIS — R05 Cough: Secondary | ICD-10-CM

## 2016-08-05 DIAGNOSIS — Z20828 Contact with and (suspected) exposure to other viral communicable diseases: Secondary | ICD-10-CM | POA: Diagnosis not present

## 2016-08-05 MED ORDER — HYDROCORTISONE 2.5 % RE CREA: 1.0000 "application " | TOPICAL_CREAM | Freq: Two times a day (BID) | RECTAL | 1 refills | Status: DC

## 2016-08-05 NOTE — Patient Instructions (Signed)
Your exam is reassuring with clear lungs and good oxygen level. I suspect you have a viral respiratory infection it is milder than most in the community and her cough may get worse and then looser before gets better. Your heart sounds fine I don't think you have a heart problem is causing this. If it isn't improving after the weekend or concerns we can get a chest x-ray so contact us call her my chart and we can put in order. We'll finish putting the referral  to Dr. Chalmers Cater.

## 2016-08-05 NOTE — Progress Notes (Signed)
Pre visit review using our clinic review tool, if applicable. No additional management support is needed unless otherwise documented below in the visit note.  Chief Complaint  Patient presents with  . Cough    Son diagnosed with flu.  [name] Headache  . Shortness of Breath    HPI: Toni Bradley 46 y.o.  Problem based visit   Cough  Onset for about a week. Headache and chest tight and not sure if ok  Began a week ago or so .  Son had flu .   Chest .  Has weaned off  Medication  Cytomel  Has a significant headache states that her blood pressure usually runs in the 90s. No specific fever or myalgias cough is nonproductive feels tight like wheezing. No pleuritic pain. No vomiting no unusual rashes. No sore throat wants to make sure her get opinion if it could be her heart or lungs.  Wants refill  Only ocass use of  anusol hcs   ROS: See pertinent positives and negatives per HPI. No hemoptysis   Edema   Past Medical History:  Diagnosis Date  . Dry skin   . Mood disorder ([place])   . Nasal polyps   . Problems related to lack of adequate sleep   . Recurrent HSV (herpes simplex virus)     Family History  Problem Relation Age of Onset  . Diabetes Other   . Thyroid disease Other     Sister  . Other Father     Parathyroid    Social History   Social History  . Marital status: Married    Spouse name: N/A  . Number of children: N/A  . Years of education: N/A   Social History Main Topics  . Smoking status: Never Smoker  . Smokeless tobacco: Never Used  . Alcohol use Yes     Comment: occ.  . Drug use: Unknown  . Sexual activity: Not Asked   Other Topics Concern  . None   Social History Narrative   Married with children   Going to school.[name]...job real estate   hh OF 4  [place].    No ets.    Work out 5 x per week.    6-7 hours              Outpatient Medications Prior to Visit  Medication Sig Dispense Refill  . estradiol (ESTRACE) 1 MG tablet Take  1 mg by mouth daily.  1  . progesterone (PROMETRIUM) 100 MG capsule TAKE 2 CAPSULES BY MOUTH EVERY NIGHT  1  . SYNTHROID 25 MCG tablet Take 1.5 tablets (37.5 mcg total) by mouth daily before breakfast. 90 tablet 1  . valACYclovir (VALTREX) 500 MG tablet TAKE 1 TABLET TWICE A DAY 60 tablet 0  . hydrocortisone (ANUSOL-HC) 2.5 % rectal cream Apply 1 application topically 2 (two) times daily. Rectal area no more than 2 weeks (Patient not taking: Reported on 08/05/2016) 30 g 0   No facility-administered medications prior to visit.      EXAM:  BP 96/62 (BP Location: Left Arm, Patient Position: Sitting, Cuff Size: Normal)   Pulse 77   Temp 98.2 F (36.8 C) (Oral)   Wt 127 lb (57.6 kg)   SpO2 98%   BMI 22.86 kg/m   Body mass index is 22.86 kg/m.  GENERAL: vitals reviewed and listed above, alert, oriented, appears well hydrated and in no acute distressNontoxic normal respirations no distress. Occasional dry cough.  HEENT: atraumatic, conjunctiva  clear, no obvious abnormalities on inspection of external nose and ears OP : no lesion edema or exudate  Face NT  NECK: no obvious masses on inspection palpation  LUNGS: clear to auscultation bilaterally, no wheezes, rales or rhonchi,  CV: HRRR, no clubbing cyanosis or  peripheral edema nl cap refill  No jvd   MS: moves all extremities without noticeable focal  abnormality    ASSESSMENT AND PLAN:  Discussed the following assessment and plan:  Coughing  Chest tightness  Autoimmune thyroiditis - Plan: Ambulatory referral to Endocrinology  Exposure to the flu We're in the middle of the influenza A epidemicAnd her son recently had a flulike illness.  She may have a mild case of a respiratory infection that is progressing. I don't see any evidence of cardiac cause  based on her symptom complex cough midline uncomfortable in her mid chest when she takes a deep breath. And no active wheezing. Expectant management alarm symptoms contact us next week  if not improving and we can order a chest x-ray as she discussed. She has weaned off the cytomel   Which is good to avoid CV  results .  Low threshold to reevaluated  -Patient advised to return or notify health care team  if symptoms worsen ,persist or new concerns arise.  Patient Instructions  Your exam is reassuring with clear lungs and good oxygen level. I suspect you have a viral respiratory infection it is milder than most in the community and her cough may get worse and then looser before gets better. Your heart sounds fine I don't think you have a heart problem is causing this. If it isn't improving after the weekend or concerns we can get a chest x-ray so contact us call her my chart and we can put in order. We'll finish putting the referral  to Dr. Chalmers Cater.         [name]. [name] M.D.

## 2016-08-26 ENCOUNTER — Encounter: Payer: Self-pay | Admitting: Internal Medicine

## 2016-08-26 DIAGNOSIS — R053 Chronic cough: Secondary | ICD-10-CM

## 2016-08-26 DIAGNOSIS — R05 Cough: Secondary | ICD-10-CM

## 2016-08-26 DIAGNOSIS — R0789 Other chest pain: Secondary | ICD-10-CM

## 2016-08-28 ENCOUNTER — Other Ambulatory Visit: Payer: Self-pay | Admitting: Internal Medicine

## 2016-09-01 ENCOUNTER — Ambulatory Visit (INDEPENDENT_AMBULATORY_CARE_PROVIDER_SITE_OTHER): Payer: BLUE CROSS/BLUE SHIELD

## 2016-09-01 DIAGNOSIS — R05 Cough: Secondary | ICD-10-CM

## 2016-09-01 DIAGNOSIS — R0789 Other chest pain: Secondary | ICD-10-CM | POA: Diagnosis not present

## 2016-09-01 DIAGNOSIS — R0602 Shortness of breath: Secondary | ICD-10-CM | POA: Diagnosis not present

## 2016-09-01 DIAGNOSIS — R053 Chronic cough: Secondary | ICD-10-CM

## 2016-09-02 NOTE — Telephone Encounter (Signed)
Just got this message  This afternoon  After the x ray was done .   Please  Explain  To her  her Cpx is a  Compete l;=physical exam as sghe requested  and   Blood work is metabolic panel cholesterol thyroid and blood count .   We don't have to do a CPX but instead  If  having a problem make an OV and I can  Order blood tests at that visit as deemed  Helpful.

## 2016-09-06 DIAGNOSIS — E063 Autoimmune thyroiditis: Secondary | ICD-10-CM | POA: Diagnosis not present

## 2016-09-06 DIAGNOSIS — E039 Hypothyroidism, unspecified: Secondary | ICD-10-CM | POA: Diagnosis not present

## 2016-09-06 DIAGNOSIS — R635 Abnormal weight gain: Secondary | ICD-10-CM | POA: Diagnosis not present

## 2016-09-19 DIAGNOSIS — Z1231 Encounter for screening mammogram for malignant neoplasm of breast: Secondary | ICD-10-CM | POA: Diagnosis not present

## 2016-09-19 DIAGNOSIS — N951 Menopausal and female climacteric states: Secondary | ICD-10-CM | POA: Diagnosis not present

## 2016-09-19 DIAGNOSIS — Z6823 Body mass index (BMI) 23.0-23.9, adult: Secondary | ICD-10-CM | POA: Diagnosis not present

## 2016-09-19 DIAGNOSIS — Z01419 Encounter for gynecological examination (general) (routine) without abnormal findings: Secondary | ICD-10-CM | POA: Diagnosis not present

## 2016-11-02 ENCOUNTER — Encounter: Payer: Self-pay | Admitting: Internal Medicine

## 2016-11-09 DIAGNOSIS — E063 Autoimmune thyroiditis: Secondary | ICD-10-CM | POA: Diagnosis not present

## 2016-12-06 ENCOUNTER — Encounter: Payer: Self-pay | Admitting: Internal Medicine

## 2016-12-06 ENCOUNTER — Ambulatory Visit (INDEPENDENT_AMBULATORY_CARE_PROVIDER_SITE_OTHER): Payer: BLUE CROSS/BLUE SHIELD | Admitting: Internal Medicine

## 2016-12-06 VITALS — BP 100/62 | HR 90 | Temp 98.0°F | Ht 62.0 in | Wt 132.2 lb

## 2016-12-06 DIAGNOSIS — E28319 Asymptomatic premature menopause: Secondary | ICD-10-CM

## 2016-12-06 DIAGNOSIS — E063 Autoimmune thyroiditis: Secondary | ICD-10-CM | POA: Diagnosis not present

## 2016-12-06 DIAGNOSIS — Z23 Encounter for immunization: Secondary | ICD-10-CM

## 2016-12-06 DIAGNOSIS — R6889 Other general symptoms and signs: Secondary | ICD-10-CM | POA: Diagnosis not present

## 2016-12-06 DIAGNOSIS — R635 Abnormal weight gain: Secondary | ICD-10-CM

## 2016-12-06 DIAGNOSIS — Z0000 Encounter for general adult medical examination without abnormal findings: Secondary | ICD-10-CM | POA: Diagnosis not present

## 2016-12-06 LAB — BASIC METABOLIC PANEL
BUN: 11 mg/dL (ref 6–23)
CO2: 30 meq/L (ref 19–32)
Calcium: 9.4 mg/dL (ref 8.4–10.5)
Chloride: 103 mEq/L (ref 96–112)
Creatinine, Ser: 0.7 mg/dL (ref 0.40–1.20)
GFR: 96.33 mL/min (ref >60.00)
Glucose, Bld: 87 mg/dL (ref 70–99)
POTASSIUM: 4.1 meq/L (ref 3.5–5.1)
Sodium: 141 mEq/L (ref 135–145)

## 2016-12-06 LAB — LIPID PANEL
CHOL/HDL RATIO: 3
CHOLESTEROL: 171 mg/dL (ref 0–200)
HDL: 62.4 mg/dL (ref >39.00)
LDL Cholesterol: 86 mg/dL (ref 0–99)
NonHDL: 108.94
Triglycerides: 117 mg/dL (ref 0.0–149.0)
VLDL: 23.4 mg/dL (ref 0.0–40.0)

## 2016-12-06 LAB — CBC WITH DIFFERENTIAL/PLATELET
BASOS PCT: 0.5 % (ref 0.0–3.0)
Basophils Absolute: 0 10*3/uL (ref 0.0–0.1)
EOS PCT: 3.9 % (ref 0.0–5.0)
Eosinophils Absolute: 0.3 10*3/uL (ref 0.0–0.7)
HEMATOCRIT: 39.7 % (ref 36.0–46.0)
HEMOGLOBIN: 13.4 g/dL (ref 12.0–15.0)
LYMPHS PCT: 18.9 % (ref 12.0–46.0)
Lymphs Abs: 1.5 10*3/uL (ref 0.7–4.0)
MCHC: 33.9 g/dL (ref 30.0–36.0)
MCV: 94.1 fl (ref 78.0–100.0)
MONOS PCT: 7.5 % (ref 3.0–12.0)
Monocytes Absolute: 0.6 10*3/uL (ref 0.1–1.0)
Neutro Abs: 5.4 10*3/uL (ref 1.4–7.7)
Neutrophils Relative %: 69.2 % (ref 43.0–77.0)
Platelets: 287 10*3/uL (ref 150.0–400.0)
RBC: 4.22 Mil/uL (ref 3.87–5.11)
RDW: 12.7 % (ref 11.5–15.5)
WBC: 7.8 10*3/uL (ref 4.0–10.5)

## 2016-12-06 LAB — TSH: TSH: 2.67 u[IU]/mL (ref 0.35–4.50)

## 2016-12-06 LAB — HEPATIC FUNCTION PANEL
ALBUMIN: 4.2 g/dL (ref 3.5–5.2)
ALT: 17 U/L (ref 0–35)
AST: 20 U/L (ref 0–37)
Alkaline Phosphatase: 60 U/L (ref 39–117)
BILIRUBIN DIRECT: 0.1 mg/dL (ref 0.0–0.3)
TOTAL PROTEIN: 6.9 g/dL (ref 6.0–8.3)
Total Bilirubin: 0.3 mg/dL (ref 0.2–1.2)

## 2016-12-06 LAB — POCT INFLUENZA A/B
INFLUENZA A, POC: NEGATIVE
INFLUENZA B, POC: NEGATIVE

## 2016-12-06 MED ORDER — HYDROCODONE-HOMATROPINE 5-1.5 MG/5ML PO SYRP: 5.0000 mL | ORAL_SOLUTION | Freq: Three times a day (TID) | ORAL | 0 refills | Status: AC | PRN

## 2016-12-06 NOTE — Patient Instructions (Signed)
Expect the viral respiratory illness to improve over the next week although you may have a minor cough. Her chest exam is normal today. Cough medicine for comfort as needed. Stay hydrated. Your weight concerns may be multifactorial as we discussed. Discuss your hormone replacement with your OB/GYN after genetic testing is back from your sister. You can also discuss any other evaluation to Dr. Chalmers Cater thinks appropriate in regard to your weight concerns.  Tetanus booster today.    Will notify you  of labs when available.  Keeping You Healthy  Get These Tests 1. Blood Pressure- Have your blood pressure checked once a year by your health care provider.  Normal blood pressure is 120/80. 2. Weight- Have your body mass index (BMI) calculated to screen for obesity.  BMI is measure of body fat based on height and weight.  You can also calculate your own BMI at GravelBags.it. 3. Cholesterol- Have your cholesterol checked every 5 years starting at age 46 then yearly starting at age 54. 66. Chlamydia, HIV, and other sexually transmitted diseases- Get screened every year until age 90, then within three months of each new sexual provider. 5. Pap Test - Every 1-5 years; discuss with your health care provider. 6. Mammogram- Every 1-2 years starting at age 46--50  Take these medicines  Calcium with Vitamin D-Your body needs 1200 mg of Calcium each day and [phone] IU of Vitamin D daily.  Your body can only absorb 500 mg of Calcium at a time so Calcium must be taken in 2 or 3 divided doses throughout the day.  Multivitamin with folic acid- Once daily if it is possible for you to become pregnant.  Get these Immunizations  Gardasil-Series of three doses; prevents HPV related illness such as genital warts and cervical cancer.  Menactra-Single dose; prevents meningitis.  Tetanus shot- Every 10 years.  Flu shot-Every year.  Take these steps 1. Do not smoke-Your healthcare provider can help  you quit.  For tips on how to quit go to www.smokefree.gov or call 1-800 QUITNOW. 2. Be physically active- Exercise 5 days a week for at least 30 minutes.  If you are not already physically active, start slow and gradually work up to 30 minutes of moderate physical activity.  Examples of moderate activity include walking briskly, dancing, swimming, bicycling, etc. 3. Breast Cancer- A self breast exam every month is important for early detection of breast cancer.  For more information and instruction on self breast exams, ask your healthcare provider or https://www.patel.info/. 4. Eat a healthy diet- Eat a variety of healthy foods such as fruits, vegetables, whole grains, low fat milk, low fat cheeses, yogurt, lean meats, poultry and fish, beans, nuts, tofu, etc.  For more information go to www. Thenutritionsource.org 5. Drink alcohol in moderation- Limit alcohol intake to one drink or less per day. Never drink and drive. 6. Depression- Your emotional health is as important as your physical health.  If you're feeling down or losing interest in things you normally enjoy please talk to your healthcare provider about being screened for depression. 7. Dental visit- Brush and floss your teeth twice daily; visit your dentist twice a year. 8. Eye doctor- Get an eye exam at least every 2 years. 9. Helmet use- Always wear a helmet when riding a bicycle, motorcycle, rollerblading or skateboarding. 59. Safe sex- If you may be exposed to sexually transmitted infections, use a condom. 11. Seat belts- Seat belts can save your live; always wear one. 12. Smoke/Carbon Monoxide detectors-  These detectors need to be installed on the appropriate level of your home. Replace batteries at least once a year. 13. Skin cancer- When out in the sun please cover up and use sunscreen 15 SPF or higher. 14. Violence- If anyone is threatening or hurting you, please tell your healthcare provider.

## 2016-12-06 NOTE — Progress Notes (Signed)
Chief Complaint  Patient presents with  . Annual Exam    HPI:  Patient  Toni Bradley  46 y.o. comes in today for Preventive Health Care visit In concerns about difficulty getting back to normal weight and continual gain. She states she exercises 6 days a week eats very healthy.   Today has flu like illness   Achy headache and cough and congestion .   Low grade fever.  For a  Few days using otc asks about cough med such as tussionex that helped in past    10 # over  4 years or so . Weigh when younger 65 or so and now   Feels 130 too high   On hrt  Test cream.  Hormone  Had premature menopause   Armour  Thyroid .  Dr Chalmers Cater.  Seems ok and tsh now in the 2 range   No more palpitations.  cv sx   Sis iagnosed with breast cancer asked about hormones genetic testing pending  Health Maintenance  Topic Date Due  . TETANUS/TDAP  04/17/1991  . HIV Screening  12/06/2017 (Originally 04/17/1987)  . INFLUENZA VACCINE  02/01/2017  . PAP SMEAR  09/18/2019   Health Maintenance Review LIFESTYLE:  Exercise:   6- 7 days a week    Tobacco/ETS: no Alcohol:  2-3 per week  Sugar beverages: no Sleep: about 6- 8  Drug use: no HH of  4 pet 2 dogs .  Work: 40 and plus     ROS:  See above hpi  GEN/ HEENT: No fever, significant weight changes sweats headaches vision problems hearing changes, CV/ PULM; No chest pain shortness of breath cough, syncope,edema  change in exercise tolerance. GI /GU: No adominal pain, vomiting, change in bowel habits. No blood in the stool. No significant GU symptoms. SKIN/HEME: ,no acute skin rashes suspicious lesions or bleeding. No lymphadenopathy, nodules, masses.  NEURO/ PSYCH:  No neurologic signs such as weakness numbness. No depression anxiety. IMM/ Allergy: No unusual infections.  Allergy .   REST of 12 system review negative except as per HPI   Past Medical History:  Diagnosis Date  . Dry skin   . Mood disorder ([place])   . Nasal polyps   .  Problems related to lack of adequate sleep   . Recurrent HSV (herpes simplex virus)     Past Surgical History:  Procedure Laterality Date  . childbirth  1998   vaginal  . NASAL POLYP SURGERY     . ovarian cyst sx  1996    Family History  Problem Relation Age of Onset  . Diabetes Other   . Thyroid disease Other        Sister  . Other Father        Parathyroid  . Breast cancer Sister     Social History   Social History  . Marital status: Married    Spouse name: N/A  . Number of children: N/A  . Years of education: N/A   Social History Main Topics  . Smoking status: Never Smoker  . Smokeless tobacco: Never Used  . Alcohol use Yes     Comment: occ.  . Drug use: No  . Sexual activity: Yes   Other Topics Concern  . 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    No ets.  Work out 5 x per week.    6-7 hours                 EXAM:  BP 100/62 (BP Location: Right Arm, Patient Position: Sitting, Cuff Size: Normal)   Pulse 90   Temp 98 F (36.7 C) (Oral)   Ht 5\' 2"  (1.575 m)   Wt 132 lb 3.2 oz (60 kg)   LMP 12/02/2016 (Exact Date)   SpO2 98%   BMI 24.18 kg/m   Body mass index is 24.18 kg/m. Wt Readings from Last 3 Encounters:  12/06/16 132 lb 3.2 oz (60 kg)  08/05/16 127 lb (57.6 kg)  07/25/16 127 lb (57.6 kg)    Physical Exam: Vital signs reviewed RCB:ULAG is a well-developed well-nourished alert cooperative    who appearsr stated age in no acute distress.She has upper respiratory congestion but is nontoxic  HEENT: normocephalic atraumatic , Eyes: PERRL EOM's full, conjunctiva clear, Nares: paten,t no deformity discharge or tenderness. Her nose is congested., Ears: no deformity EAC's clear TMs with normal landmarks. Mouth: clear OP, no lesions, edema.  Moist mucous membranes. Dentition in adequate repair. NECK: supple without masses, thyromegaly or  bruits. CHEST/PULM:  Clear to auscultation and percussion breath sounds equal no wheeze , rales or rhonchi. No chest wall deformities or tenderness. Breast: normal by inspection . No dimpling, discharge, masses, tenderness or discharge . CV: PMI is nondisplaced, S1 S2 no gallops, murmurs, rubs. Peripheral pulses are full without delay.No JVD .  ABDOMEN: Bowel sounds normal nontender  No guard or rebound, no hepato splenomegal no CVA tenderness.  No hernia. Extremtities:  No clubbing cyanosis or edema, no acute joint swelling or redness no focal atrophy NEURO:  Oriented x3, cranial nerves 3-12 appear to be intact, no obvious focal weakness,gait within normal limits no abnormal reflexes or asymmetrical SKIN: No acute rashes normal turgor, color, no bruising or petechiae. PSYCH: Oriented, good eye contact, no obvious depression anxiety, cognition and judgment appear normal. LN: no cervical axillary inguinal adenopathy  Lab Results  Component Value Date   WBC 7.8 12/06/2016   HGB 13.4 12/06/2016   HCT 39.7 12/06/2016   PLT 287.0 12/06/2016   GLUCOSE 87 12/06/2016   CHOL 171 12/06/2016   TRIG 117.0 12/06/2016   HDL 62.40 12/06/2016   LDLCALC 86 12/06/2016   ALT 17 12/06/2016   AST 20 12/06/2016   NA 141 12/06/2016   K 4.1 12/06/2016   CL 103 12/06/2016   CREATININE 0.70 12/06/2016   BUN 11 12/06/2016   CO2 30 12/06/2016   TSH 2.67 12/06/2016    BP Readings from Last 3 Encounters:  12/06/16 100/62  08/05/16 96/62  07/25/16 118/82   Wt Readings from Last 3 Encounters:  12/06/16 132 lb 3.2 oz (60 kg)  08/05/16 127 lb (57.6 kg)  07/25/16 127 lb (57.6 kg)    Rapid flu test is negative Lab results reviewed with patient   ASSESSMENT AND PLAN:  Discussed the following assessment and plan:  Visit for preventive health examination - Plan: Basic metabolic panel, CBC with Differential/Platelet, Hepatic function panel, Lipid panel, TSH, POCT Influenza A/B  Flu-like symptoms - Plan:  Basic metabolic panel, CBC with Differential/Platelet, Hepatic function panel, Lipid panel, TSH, POCT Influenza A/B  Autoimmune thyroiditis - Plan: Basic metabolic panel, CBC with Differential/Platelet, Hepatic function panel, Lipid panel, TSH, POCT Influenza A/B  Weight gain - Plan: Basic metabolic panel, CBC with Differential/Platelet, Hepatic function panel, Lipid panel, TSH, POCT Influenza A/B  Premature menopause - Plan: Basic metabolic panel, CBC with Differential/Platelet, Hepatic function panel, Lipid panel, TSH, POCT Influenza A/B  Need for prophylactic vaccination with tetanus-diphtheria (Td) - Plan: Td vaccine greater than or equal to 7yo preservative free IM I suspect she has a viral respiratory infection is uncomplicated discussed risk-benefit of cough medicine prescription given today, voiding extended release narcotics. Follow-up with alarm symptoms. Discussed her concerns about weight control etc. she should discuss her hormone replacement therapy with her GYN although with pretty premature menopause usually want to stay on some type of hormonal therapy. Also can discuss with Dr. Chalmers Cater any concerns or input she has about  Weight and issues with age and metabolic rate illness etc .  She is still at a healthy weight. But I agree this happened over about 3 years  ? If menopause other factors  If tsh better may help a little bit .  Patient Care Team: Mersadez Linden, [name], MD as PCP - General (Internal Medicine) Louretta Shorten, MD as Consulting Physician (Obstetrics and Gynecology) Jacelyn Pi, MD as Consulting Physician (Endocrinology)   Allergies as of 12/06/2016      Reactions   Nitrofurantoin    REACTION: hives 3-4 days off medication      Medication List       Accurate as of 12/06/16  6:33 PM. Always use your most recent med list.          ARMOUR THYROID 15 MG tablet Generic drug:  thyroid TAKE 1 TABLET BY MOUTH EVERY MORNING ON AN EMPTY STOMACH   estradiol 2 MG  tablet Commonly known as:  ESTRACE Take 2 mg by mouth daily.   HYDROcodone-homatropine 5-1.5 MG/5ML syrup Commonly known as:  HYCODAN Take 5 mLs by mouth every 8 (eight) hours as needed for cough.   hydrocortisone 2.5 % rectal cream Commonly known as:  ANUSOL-HC Apply 1 application topically 2 (two) times daily. Rectal area no more than 2 weeks   progesterone 100 MG capsule Commonly known as:  PROMETRIUM TAKE 2 CAPSULES BY MOUTH EVERY NIGHT   valACYclovir 500 MG tablet Commonly known as:  VALTREX TAKE 1 TABLET TWICE A DAY       Patient Instructions  Expect the viral respiratory illness to improve over the next week although you may have a minor cough. Her chest exam is normal today. Cough medicine for comfort as needed. Stay hydrated. Your weight concerns may be multifactorial as we discussed. Discuss your hormone replacement with your OB/GYN after genetic testing is back from your sister. You can also discuss any other evaluation to Dr. Chalmers Cater thinks appropriate in regard to your weight concerns.  Tetanus booster today.    Will notify you  of labs when available.  Keeping You Healthy  Get These Tests 1. Blood Pressure- Have your blood pressure checked once a year by your health care provider.  Normal blood pressure is 120/80. 2. Weight- Have your body mass index (BMI) calculated to screen for obesity.  BMI is measure of body fat based on height and weight.  You can also calculate your own BMI at [employer]velBags.it. 3. Cholesterol- Have your cholesterol checked every 5 years starting at age 70 then yearly starting at age 78. 59. Chlamydia, HIV, and other sexually transmitted diseases- Get screened every year until age 91, then within three months of each new sexual provider. 5. Pap Test - Every 1-5 years; discuss with your health care provider. 6. Mammogram- Every 1-2 years starting at age 24--50  Take  these medicines  Calcium with Vitamin D-Your body needs 1200 mg  of Calcium each day and [phone] IU of Vitamin D daily.  Your body can only absorb 500 mg of Calcium at a time so Calcium must be taken in 2 or 3 divided doses throughout the day.  Multivitamin with folic acid- Once daily if it is possible for you to become pregnant.  Get these Immunizations  Gardasil-Series of three doses; prevents HPV related illness such as genital warts and cervical cancer.  Menactra-Single dose; prevents meningitis.  Tetanus shot- Every 10 years.  Flu shot-Every year.  Take these steps 1. Do not smoke-Your healthcare provider can help you quit.  For tips on how to quit go to www.smokefree.gov or call 1-800 QUITNOW. 2. Be physically active- Exercise 5 days a week for at least 30 minutes.  If you are not already physically active, start slow and gradually work up to 30 minutes of moderate physical activity.  Examples of moderate activity include walking briskly, dancing, swimming, bicycling, etc. 3. Breast Cancer- A self breast exam every month is important for early detection of breast cancer.  For more information and instruction on self breast exams, ask your healthcare provider or https://www.patel.info/. 4. Eat a healthy diet- Eat a variety of healthy foods such as fruits, vegetables, whole grains, low fat milk, low fat cheeses, yogurt, lean meats, poultry and fish, beans, nuts, tofu, etc.  For more information go to www. Thenutritionsource.org 5. Drink alcohol in moderation- Limit alcohol intake to one drink or less per day. Never drink and drive. 6. Depression- Your emotional health is as important as your physical health.  If you're feeling down or losing interest in things you normally enjoy please talk to your healthcare provider about being screened for depression. 7. Dental visit- Brush and floss your teeth twice daily; visit your dentist twice a year. 8. Eye doctor- Get an eye exam at least every 2 years. 9. Helmet use- Always wear a  helmet when riding a bicycle, motorcycle, rollerblading or skateboarding. 34. Safe sex- If you may be exposed to sexually transmitted infections, use a condom. 11. Seat belts- Seat belts can save your live; always wear one. 12. Smoke/Carbon Monoxide detectors- These detectors need to be installed on the appropriate level of your home. Replace batteries at least once a year. 13. Skin cancer- When out in the sun please cover up and use sunscreen 15 SPF or higher. 14. Violence- If anyone is threatening or hurting you, please tell your healthcare provider.           [name]. Deauna Yaw M.D.

## 2016-12-21 ENCOUNTER — Encounter: Payer: Self-pay | Admitting: Internal Medicine

## 2016-12-22 ENCOUNTER — Telehealth: Payer: Self-pay

## 2016-12-22 MED ORDER — PROMETHAZINE HCL 25 MG PO TABS: 25.0000 mg | ORAL_TABLET | ORAL | 0 refills | Status: DC | PRN

## 2016-12-22 NOTE — Telephone Encounter (Signed)
LMTCB

## 2016-12-22 NOTE — Telephone Encounter (Signed)
Ok to send in phenergan 25 mg 1 po q4 hours prn vomiting. Disp 12 no refills

## 2016-12-22 NOTE — Telephone Encounter (Signed)
  Sorry   There is no simple answer right now. Your cholesterol is still very good and  Repeating  Most likely mean syou have to pay out of pocket for that tests as not indicated and wont help [place] solve the problem .  We could do a fasting insulin level and am cortisol and repeat your thyroid antibodies .  I am not sure what hormone levels will help as   Not related to weight gain  And your gyne should be involved  . Sometimes  If having hot flushes  Getting a  Estradiol level can be helpful   To adjust dosing  . Otherwise not sure .  Are you getting excess body hair or taking any supplements with hormones in it?      Let me know and we can order the above testing  Fasting   Then I would like you to talk with dr Chalmers Cater and your Gyne  For any other ideas.

## 2016-12-22 NOTE — Telephone Encounter (Signed)
Spoke with pt and she has n/v/d since yesterday. She thinks she may have food poisoning. She is unable to keep anything down and vomiting has now become dry heaves. She would like to have phenergan sent to pharmacy.  Dr. Regis Bill - Please advise. Thanks!

## 2016-12-22 NOTE — Telephone Encounter (Signed)
Spoke with pt and advised. Also reviewed hydration/food intake. Advised pt to contact office if not improving or medication not helping. She voiced understanding. Nothing further needed at this time.

## 2016-12-23 DIAGNOSIS — B09 Unspecified viral infection characterized by skin and mucous membrane lesions: Secondary | ICD-10-CM | POA: Diagnosis not present

## 2017-01-09 DIAGNOSIS — N951 Menopausal and female climacteric states: Secondary | ICD-10-CM | POA: Diagnosis not present

## 2017-01-10 DIAGNOSIS — N951 Menopausal and female climacteric states: Secondary | ICD-10-CM | POA: Diagnosis not present

## 2017-01-16 ENCOUNTER — Other Ambulatory Visit: Payer: Self-pay | Admitting: Internal Medicine

## 2017-01-17 MED ORDER — VALACYCLOVIR HCL 500 MG PO TABS: 500.0000 mg | ORAL_TABLET | Freq: Two times a day (BID) | ORAL | 6 refills | Status: DC

## 2017-01-17 NOTE — Telephone Encounter (Signed)
Sent to pharmacy 

## 2017-02-06 DIAGNOSIS — G4700 Insomnia, unspecified: Secondary | ICD-10-CM | POA: Diagnosis not present

## 2017-02-06 DIAGNOSIS — R5383 Other fatigue: Secondary | ICD-10-CM | POA: Diagnosis not present

## 2017-02-06 DIAGNOSIS — R6882 Decreased libido: Secondary | ICD-10-CM | POA: Diagnosis not present

## 2017-02-06 DIAGNOSIS — R232 Flushing: Secondary | ICD-10-CM | POA: Diagnosis not present

## 2017-03-02 DIAGNOSIS — G4700 Insomnia, unspecified: Secondary | ICD-10-CM | POA: Diagnosis not present

## 2017-03-02 DIAGNOSIS — R6882 Decreased libido: Secondary | ICD-10-CM | POA: Diagnosis not present

## 2017-03-02 DIAGNOSIS — N951 Menopausal and female climacteric states: Secondary | ICD-10-CM | POA: Diagnosis not present

## 2017-03-07 DIAGNOSIS — R4586 Emotional lability: Secondary | ICD-10-CM | POA: Diagnosis not present

## 2017-03-07 DIAGNOSIS — R6882 Decreased libido: Secondary | ICD-10-CM | POA: Diagnosis not present

## 2017-03-07 DIAGNOSIS — G4700 Insomnia, unspecified: Secondary | ICD-10-CM | POA: Diagnosis not present

## 2017-03-24 ENCOUNTER — Encounter: Payer: Self-pay | Admitting: Internal Medicine

## 2017-05-23 DIAGNOSIS — J3089 Other allergic rhinitis: Secondary | ICD-10-CM | POA: Diagnosis not present

## 2017-05-23 DIAGNOSIS — J343 Hypertrophy of nasal turbinates: Secondary | ICD-10-CM | POA: Diagnosis not present

## 2017-05-23 DIAGNOSIS — Z9889 Other specified postprocedural states: Secondary | ICD-10-CM | POA: Diagnosis not present

## 2017-05-23 DIAGNOSIS — R0981 Nasal congestion: Secondary | ICD-10-CM | POA: Diagnosis not present

## 2017-06-05 DIAGNOSIS — G4700 Insomnia, unspecified: Secondary | ICD-10-CM | POA: Diagnosis not present

## 2017-06-05 DIAGNOSIS — R4586 Emotional lability: Secondary | ICD-10-CM | POA: Diagnosis not present

## 2017-06-05 DIAGNOSIS — R6882 Decreased libido: Secondary | ICD-10-CM | POA: Diagnosis not present

## 2017-06-07 DIAGNOSIS — R6882 Decreased libido: Secondary | ICD-10-CM | POA: Diagnosis not present

## 2017-06-07 DIAGNOSIS — R4586 Emotional lability: Secondary | ICD-10-CM | POA: Diagnosis not present

## 2017-06-07 DIAGNOSIS — N921 Excessive and frequent menstruation with irregular cycle: Secondary | ICD-10-CM | POA: Diagnosis not present

## 2017-06-07 DIAGNOSIS — G4700 Insomnia, unspecified: Secondary | ICD-10-CM | POA: Diagnosis not present

## 2017-07-03 DIAGNOSIS — R4586 Emotional lability: Secondary | ICD-10-CM | POA: Diagnosis not present

## 2017-07-03 DIAGNOSIS — G4700 Insomnia, unspecified: Secondary | ICD-10-CM | POA: Diagnosis not present

## 2017-07-26 ENCOUNTER — Ambulatory Visit: Payer: BLUE CROSS/BLUE SHIELD | Admitting: Internal Medicine

## 2017-07-26 ENCOUNTER — Encounter: Payer: Self-pay | Admitting: Internal Medicine

## 2017-07-26 VITALS — BP 90/60 | HR 78 | Temp 98.1°F | Wt 134.8 lb

## 2017-07-26 DIAGNOSIS — Z20828 Contact with and (suspected) exposure to other viral communicable diseases: Secondary | ICD-10-CM

## 2017-07-26 DIAGNOSIS — J069 Acute upper respiratory infection, unspecified: Secondary | ICD-10-CM | POA: Diagnosis not present

## 2017-07-26 DIAGNOSIS — B9789 Other viral agents as the cause of diseases classified elsewhere: Secondary | ICD-10-CM | POA: Diagnosis not present

## 2017-07-26 LAB — POCT INFLUENZA A/B
INFLUENZA B, POC: NEGATIVE
Influenza A, POC: NEGATIVE

## 2017-07-26 MED ORDER — HYDROCODONE-HOMATROPINE 5-1.5 MG/5ML PO SYRP: 5.0000 mL | ORAL_SOLUTION | Freq: Three times a day (TID) | ORAL | 0 refills | Status: DC | PRN

## 2017-07-26 NOTE — Patient Instructions (Addendum)
This is a viral respiratory infection that should resolve on its own and not helped with antibiotics.   Cough can last 18 days but usually feel a lot  better after the first 7 days  Acute bronchitis is usually viral and  Not reponsive to antibiotics  In people with normal lungs   meds  Are for for comfort In day try mucinex dm or similar   Hot tea and honey    Stay hydrated .  Comfort cough med at night   Fu if   persistent or progressive not a lot better  In 5-7 days or fever  shortness of breath etc .      Acute Bronchitis, Adult Acute bronchitis is sudden (acute) swelling of the air tubes (bronchi) in the lungs. Acute bronchitis causes these tubes to fill with mucus, which can make it hard to breathe. It can also cause coughing or wheezing. In adults, acute bronchitis usually goes away within 2 weeks. A cough caused by bronchitis may last up to 3 weeks. Smoking, allergies, and asthma can make the condition worse. Repeated episodes of bronchitis may cause further lung problems, such as chronic obstructive pulmonary disease (COPD). What are the causes? This condition can be caused by germs and by substances that irritate the lungs, including:  Cold and flu viruses. This condition is most often caused by the same virus that causes a cold.  Bacteria.  Exposure to tobacco smoke, dust, fumes, and air pollution.  What increases the risk? This condition is more likely to develop in people who:  Have close contact with someone with acute bronchitis.  Are exposed to lung irritants, such as tobacco smoke, dust, fumes, and vapors.  Have a weak immune system.  Have a respiratory condition such as asthma.  What are the signs or symptoms? Symptoms of this condition include:  A cough.  Coughing up clear, yellow, or green mucus.  Wheezing.  Chest congestion.  Shortness of breath.  A fever.  Body aches.  Chills.  A sore throat.  How is this diagnosed? This condition is  usually diagnosed with a physical exam. During the exam, your health care provider may order tests, such as chest X-rays, to rule out other conditions. He or she may also:  Test a sample of your mucus for bacterial infection.  Check the level of oxygen in your blood. This is done to check for pneumonia.  Do a chest X-ray or lung function testing to rule out pneumonia and other conditions.  Perform blood tests.  Your health care provider will also ask about your symptoms and medical history. How is this treated? Most cases of acute bronchitis clear up over time without treatment. Your health care provider may recommend:  Drinking more fluids. Drinking more makes your mucus thinner, which may make it easier to breathe.  Taking a medicine for a fever or cough.  Taking an antibiotic medicine.  Using an inhaler to help improve shortness of breath and to control a cough.  Using a cool mist vaporizer or humidifier to make it easier to breathe.  Follow these instructions at home: Medicines  Take over-the-counter and prescription medicines only as told by your health care provider.  If you were prescribed an antibiotic, take it as told by your health care provider. Do not stop taking the antibiotic even if you start to feel better. General instructions  Get plenty of rest.  Drink enough fluids to keep your urine clear or pale yellow.  Avoid  smoking and secondhand smoke. Exposure to cigarette smoke or irritating chemicals will make bronchitis worse. If you smoke and you need help quitting, ask your health care provider. Quitting smoking will help your lungs heal faster.  Use an inhaler, cool mist vaporizer, or humidifier as told by your health care provider.  Keep all follow-up visits as told by your health care provider. This is important. How is this prevented? To lower your risk of getting this condition again:  Wash your hands often with soap and water. If soap and water are not  available, use hand sanitizer.  Avoid contact with people who have cold symptoms.  Try not to touch your hands to your mouth, nose, or eyes.  Make sure to get the flu shot every year.  Contact a health care provider if:  Your symptoms do not improve in 2 weeks of treatment. Get help right away if:  You cough up blood.  You have chest pain.  You have severe shortness of breath.  You become dehydrated.  You faint or keep feeling like you are going to faint.  You keep vomiting.  You have a severe headache.  Your fever or chills gets worse. This information is not intended to replace advice given to you by your health care provider. Make sure you discuss any questions you have with your health care provider. Document Released: 07/28/2004 Document Revised: 01/13/2016 Document Reviewed: 12/09/2015 Elsevier Interactive Patient Education  [employer].

## 2017-07-26 NOTE — Progress Notes (Signed)
Chief Complaint  Patient presents with  . Bronchitis    Pt states that she was sick with a fever back the end of December and treated with Theraflu. Pt states that her son has flu  right now. Pt states that she has sore throat, ear pain and chest congestion. Pt took Mucinex Cold and Flu this morning. Runny nose and PND.     HPI: Toni Bradley 47 y.o.   SDA  For concern about "bronchitis "  Got better from December illness    Illness  Dec 31  With fever and got beter  And then son  Age 2 had flu dx  Yesterday and then  3 days ago  Had  St cough  began in chest. .  Sore mid chest  No fever  Has ur congestion .  No pain face ears but clogged   Took mucinex cougd and cough   No hemoptysis sob wheezing or hx of  CLD    ROS: See pertinent positives and negatives per HPI.  Past Medical History:  Diagnosis Date  . Dry skin   . Mood disorder ([place])   . Nasal polyps   . Problems related to lack of adequate sleep   . Recurrent HSV (herpes simplex virus)    Past Surgical History:  Procedure Laterality Date  . childbirth  1998   vaginal  . NASAL POLYP SURGERY     . ovarian cyst sx  1996     Family History  Problem Relation Age of Onset  . Diabetes Other   . Thyroid disease Other        Sister  . Other Father        Parathyroid  . Breast cancer Sister     Social History   Socioeconomic History  . Marital status: Married    Spouse name: None  . Number of children: None  . Years of education: None  . Highest education level: None  Social Needs  . Financial resource strain: None  . Food insecurity - worry: None  . Food insecurity - inability: None  . Transportation needs - medical: None  . Transportation needs - non-medical: None  Occupational History  . None  Tobacco Use  . Smoking status: Never Smoker  . Smokeless tobacco: Never Used  Substance and Sexual Activity  . Alcohol use: Yes    Comment: occ.  . Drug use: No  . Sexual activity: Yes  Other Topics  Concern  . 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    No ets.    Work out 5 x per week.    6-7 hours              Outpatient Medications Prior to Visit  Medication Sig Dispense Refill  . ARMOUR THYROID 15 MG tablet TAKE 1 TABLET BY MOUTH EVERY MORNING ON AN EMPTY STOMACH  6  . estradiol (ESTRACE) 2 MG tablet Take 2 mg by mouth daily.  12  . hydrocortisone (ANUSOL-HC) 2.5 % rectal cream Apply 1 application topically 2 (two) times daily. Rectal area no more than 2 weeks 30 g 1  . progesterone (PROMETRIUM) 100 MG capsule TAKE 2 CAPSULES BY MOUTH EVERY NIGHT  1  . promethazine (PHENERGAN) 25 MG tablet Take 1 tablet (25 mg total) by mouth every 4 (four) hours as needed for nausea  or vomiting. 12 tablet 0  . valACYclovir (VALTREX) 500 MG tablet Take 1 tablet (500 mg total) by mouth 2 (two) times daily. 60 tablet 6   No facility-administered medications prior to visit.      EXAM:  BP 90/60 (BP Location: Left Arm, Patient Position: Sitting, Cuff Size: Normal)   Pulse 78   Temp 98.1 F (36.7 C) (Oral)   Wt 134 lb 12.8 oz (61.1 kg)   SpO2 98%   BMI 24.66 kg/m   Body mass index is 24.66 kg/m. WDWN in NAD  quiet respirations; mildly congested  somewhat hoarse. Non toxic .  Dry bronchial cough   HEENT: Normocephalic ;atraumatic , Eyes;  PERRL, EOMs  Full, lids and conjunctiva clear,,Ears: no deformities, canals nl, TM landmarks normal, Nose: no deformity or discharge but congested;face non tender Mouth : OP clear without lesion or edema . Neck: Supple without adenopathy or masses or bruits Chest:  Clear to A&P without wheezes rales or rhonchi CV:  S1-S2 no gallops or murmurs peripheral perfusion is normal Skin :nl perfusion and no acute rashes    ASSESSMENT AND PLAN:  Discussed the following assessment and plan:  Viral upper respiratory tract infection with cough - Plan: POC Influenza  A/B  Exposure to influenza - Plan: POC Influenza A/B Could be low grade flu but no fever  And could e paraflu  Exam and hx cw viral resp infection   No alarm features and is communicable    Expectant management. And supportive  Comfort care  . And fu if alarm sx  persistent or progressive   Send in cough med to her pharmacy electronically   [place] for night use .  -Patient advised to return or notify health care team  if symptoms worsen ,persist or new concerns arise.  Patient Instructions  This is a viral respiratory infection that should resolve on its own and not helped with antibiotics.   Cough can last 18 days but usually feel a lot  better after the first 7 days  Acute bronchitis is usually viral and  Not reponsive to antibiotics  In people with normal lungs   meds  Are for for comfort In day try mucinex dm or similar   Hot tea and honey    Stay hydrated .  Comfort cough med at night   Fu if   persistent or progressive not a lot better  In 5-7 days or fever  shortness of breath etc .      Acute Bronchitis, Adult Acute bronchitis is sudden (acute) swelling of the air tubes (bronchi) in the lungs. Acute bronchitis causes these tubes to fill with mucus, which can make it hard to breathe. It can also cause coughing or wheezing. In adults, acute bronchitis usually goes away within 2 weeks. A cough caused by bronchitis may last up to 3 weeks. Smoking, allergies, and asthma can make the condition worse. Repeated episodes of bronchitis may cause further lung problems, such as chronic obstructive pulmonary disease (COPD). What are the causes? This condition can be caused by germs and by substances that irritate the lungs, including:  Cold and flu viruses. This condition is most often caused by the same virus that causes a cold.  Bacteria.  Exposure to tobacco smoke, dust, fumes, and air pollution.  What increases the risk? This condition is more likely to develop in people  who:  Have close contact with someone with acute bronchitis.  Are exposed to lung irritants, such as tobacco  smoke, dust, fumes, and vapors.  Have a weak immune system.  Have a respiratory condition such as asthma.  What are the signs or symptoms? Symptoms of this condition include:  A cough.  Coughing up clear, yellow, or green mucus.  Wheezing.  Chest congestion.  Shortness of breath.  A fever.  Body aches.  Chills.  A sore throat.  How is this diagnosed? This condition is usually diagnosed with a physical exam. During the exam, your health care provider may order tests, such as chest X-rays, to rule out other conditions. He or she may also:  Test a sample of your mucus for bacterial infection.  Check the level of oxygen in your blood. This is done to check for pneumonia.  Do a chest X-ray or lung function testing to rule out pneumonia and other conditions.  Perform blood tests.  Your health care provider will also ask about your symptoms and medical history. How is this treated? Most cases of acute bronchitis clear up over time without treatment. Your health care provider may recommend:  Drinking more fluids. Drinking more makes your mucus thinner, which may make it easier to breathe.  Taking a medicine for a fever or cough.  Taking an antibiotic medicine.  Using an inhaler to help improve shortness of breath and to control a cough.  Using a cool mist vaporizer or humidifier to make it easier to breathe.  Follow these instructions at home: Medicines  Take over-the-counter and prescription medicines only as told by your health care provider.  If you were prescribed an antibiotic, take it as told by your health care provider. Do not stop taking the antibiotic even if you start to feel better. General instructions  Get plenty of rest.  Drink enough fluids to keep your urine clear or pale yellow.  Avoid smoking and secondhand smoke. Exposure to  cigarette smoke or irritating chemicals will make bronchitis worse. If you smoke and you need help quitting, ask your health care provider. Quitting smoking will help your lungs heal faster.  Use an inhaler, cool mist vaporizer, or humidifier as told by your health care provider.  Keep all follow-up visits as told by your health care provider. This is important. How is this prevented? To lower your risk of getting this condition again:  Wash your hands often with soap and water. If soap and water are not available, use hand sanitizer.  Avoid contact with people who have cold symptoms.  Try not to touch your hands to your mouth, nose, or eyes.  Make sure to get the flu shot every year.  Contact a health care provider if:  Your symptoms do not improve in 2 weeks of treatment. Get help right away if:  You cough up blood.  You have chest pain.  You have severe shortness of breath.  You become dehydrated.  You faint or keep feeling like you are going to faint.  You keep vomiting.  You have a severe headache.  Your fever or chills gets worse. This information is not intended to replace advice given to you by your health care provider. Make sure you discuss any questions you have with your health care provider. Document Released: 07/28/2004 Document Revised: 01/13/2016 Document Reviewed: 12/09/2015 Elsevier Interactive Patient Education  2018 [employer]. [name] M.D.

## 2017-07-27 IMAGING — DX DG CHEST 2V
2 series · 2 of 2 positions shown · non-contrast
Comparison: PA and lateral chest x-ray August 21, 2011

CLINICAL DATA: Increasing shortness of breath, chest heaviness,
nonproductive cough, and fatigue over the past week. Nonsmoker.

EXAM:
CHEST  2 VIEW

[chest pa]
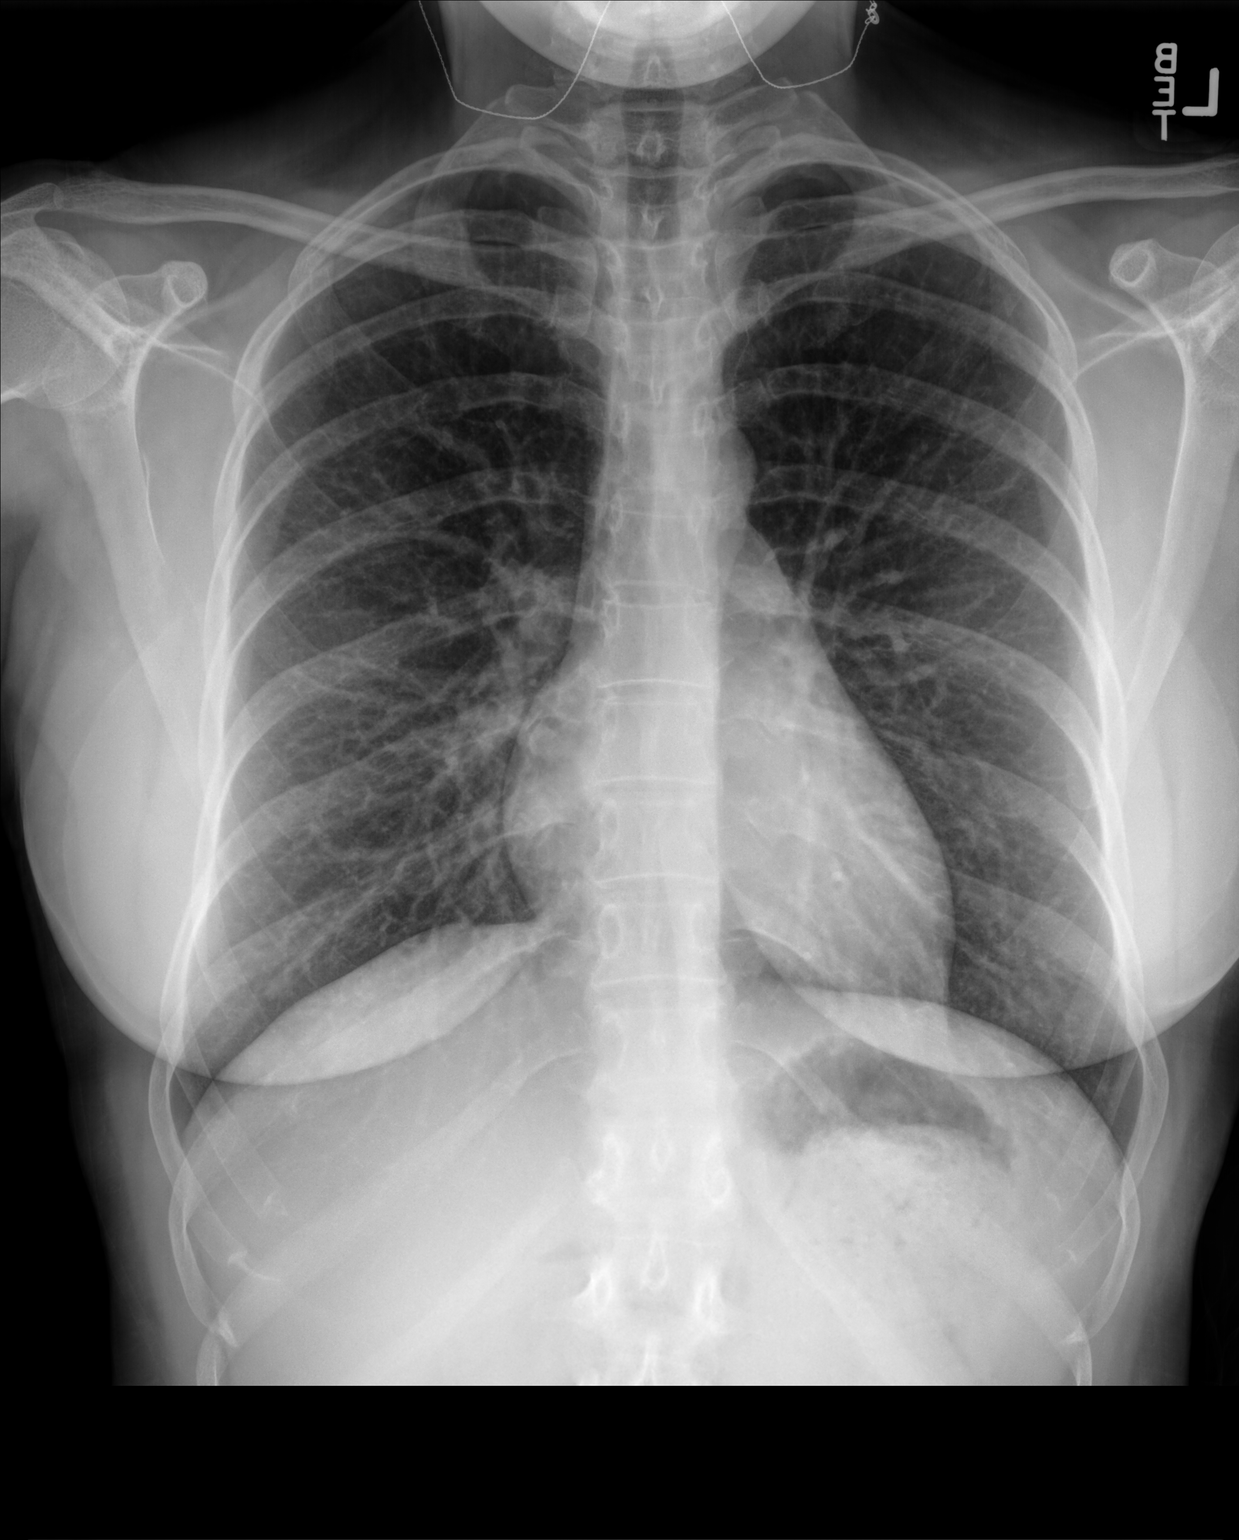

[chest lat]
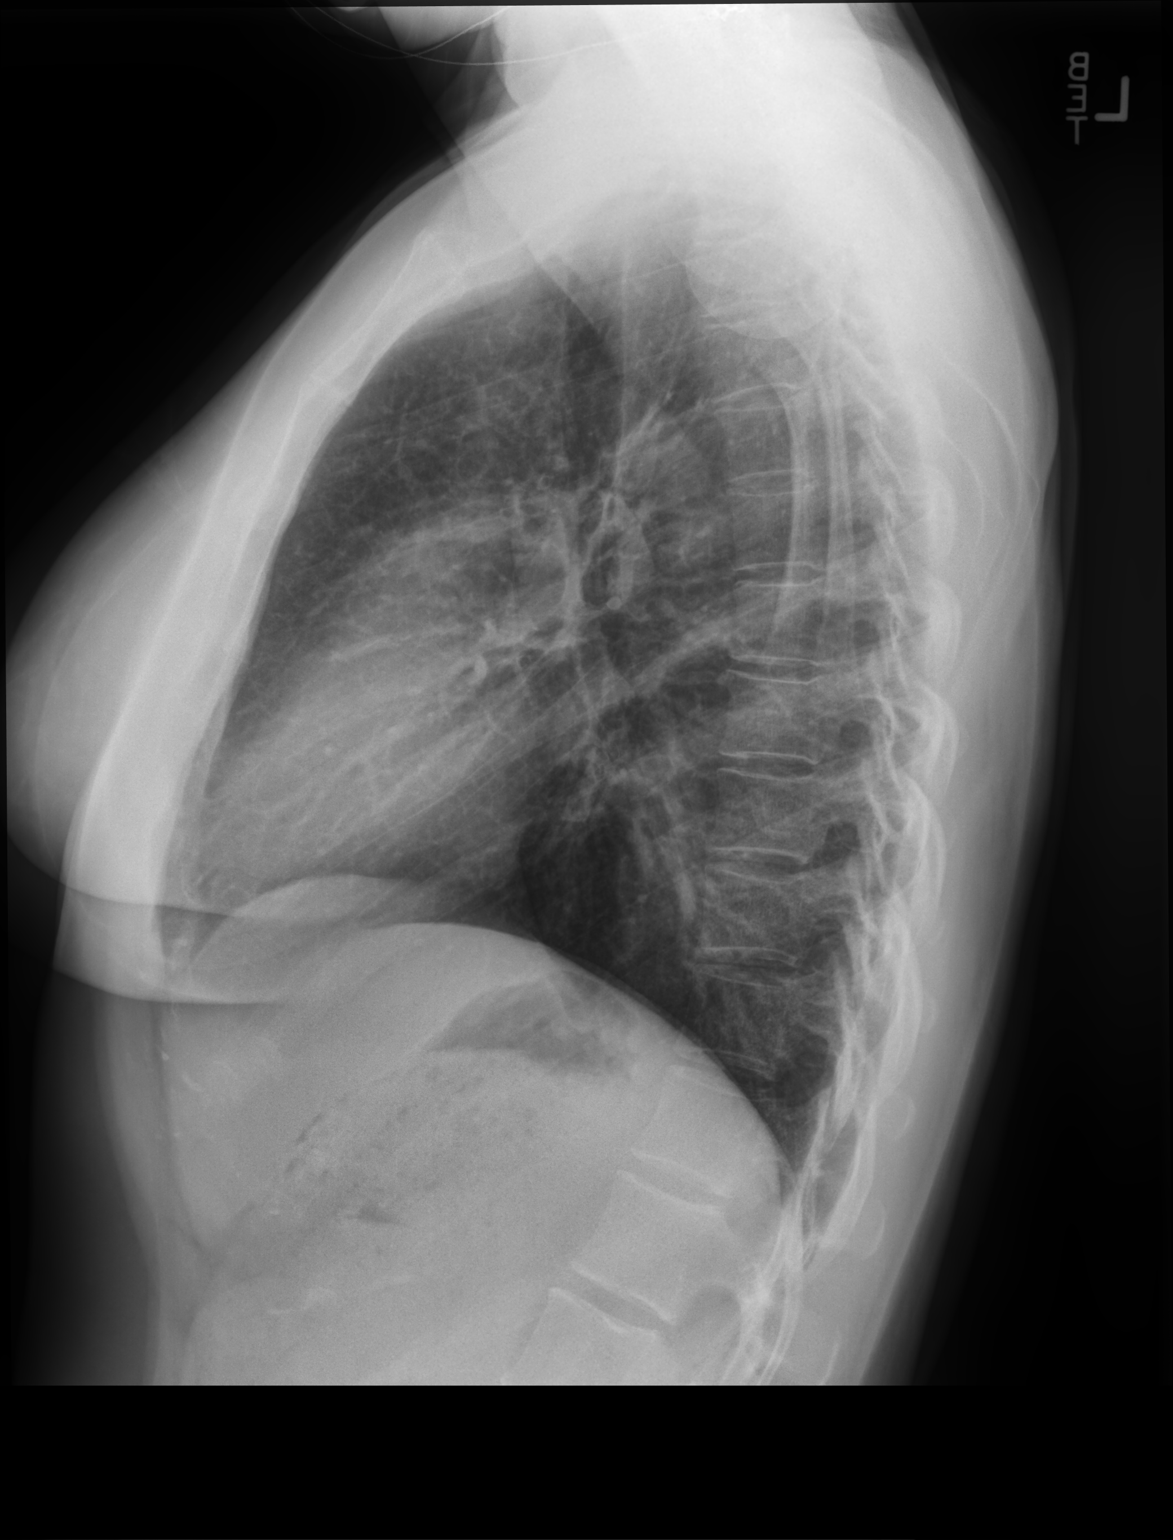

[2 of 2 positions shown; findings below may reference images not displayed]

FINDINGS: The lungs are well-expanded. The interstitial markings are more
prominent than on the earlier study but some of this is due to
changes in radiographic technique. There is no alveolar infiltrate.
There is no pleural effusion. The heart is normal in size. The
pulmonary vascularity is not engorged. The mediastinum is normal in
width. The bony thorax exhibits no acute abnormality. Bilateral
breast implants are present.
IMPRESSION: There is no pneumonia, CHF, nor other acute cardiopulmonary
abnormalities.

## 2017-08-03 DIAGNOSIS — R4586 Emotional lability: Secondary | ICD-10-CM | POA: Diagnosis not present

## 2017-08-03 DIAGNOSIS — R635 Abnormal weight gain: Secondary | ICD-10-CM | POA: Diagnosis not present

## 2017-08-03 DIAGNOSIS — N951 Menopausal and female climacteric states: Secondary | ICD-10-CM | POA: Diagnosis not present

## 2017-08-03 DIAGNOSIS — G4700 Insomnia, unspecified: Secondary | ICD-10-CM | POA: Diagnosis not present

## 2017-08-04 DIAGNOSIS — E039 Hypothyroidism, unspecified: Secondary | ICD-10-CM | POA: Diagnosis not present

## 2017-08-04 DIAGNOSIS — R635 Abnormal weight gain: Secondary | ICD-10-CM | POA: Diagnosis not present

## 2017-08-04 DIAGNOSIS — R6882 Decreased libido: Secondary | ICD-10-CM | POA: Diagnosis not present

## 2017-08-04 DIAGNOSIS — N951 Menopausal and female climacteric states: Secondary | ICD-10-CM | POA: Diagnosis not present

## 2017-08-21 DIAGNOSIS — E039 Hypothyroidism, unspecified: Secondary | ICD-10-CM | POA: Diagnosis not present

## 2017-08-25 DIAGNOSIS — F900 Attention-deficit hyperactivity disorder, predominantly inattentive type: Secondary | ICD-10-CM | POA: Diagnosis not present

## 2017-08-31 DIAGNOSIS — E039 Hypothyroidism, unspecified: Secondary | ICD-10-CM | POA: Diagnosis not present

## 2017-08-31 DIAGNOSIS — E063 Autoimmune thyroiditis: Secondary | ICD-10-CM | POA: Diagnosis not present

## 2017-09-06 DIAGNOSIS — R7301 Impaired fasting glucose: Secondary | ICD-10-CM | POA: Diagnosis not present

## 2017-09-21 ENCOUNTER — Other Ambulatory Visit: Payer: Self-pay | Admitting: *Deleted

## 2017-09-28 DIAGNOSIS — Z6823 Body mass index (BMI) 23.0-23.9, adult: Secondary | ICD-10-CM | POA: Diagnosis not present

## 2017-09-28 DIAGNOSIS — Z1231 Encounter for screening mammogram for malignant neoplasm of breast: Secondary | ICD-10-CM | POA: Diagnosis not present

## 2017-09-28 DIAGNOSIS — Z01419 Encounter for gynecological examination (general) (routine) without abnormal findings: Secondary | ICD-10-CM | POA: Diagnosis not present

## 2017-09-29 DIAGNOSIS — R6882 Decreased libido: Secondary | ICD-10-CM | POA: Diagnosis not present

## 2017-09-29 DIAGNOSIS — E039 Hypothyroidism, unspecified: Secondary | ICD-10-CM | POA: Diagnosis not present

## 2017-09-29 DIAGNOSIS — G4700 Insomnia, unspecified: Secondary | ICD-10-CM | POA: Diagnosis not present

## 2017-09-29 DIAGNOSIS — N951 Menopausal and female climacteric states: Secondary | ICD-10-CM | POA: Diagnosis not present

## 2017-09-29 DIAGNOSIS — R4586 Emotional lability: Secondary | ICD-10-CM | POA: Diagnosis not present

## 2017-10-03 ENCOUNTER — Other Ambulatory Visit: Payer: Self-pay | Admitting: *Deleted

## 2017-10-16 DIAGNOSIS — N951 Menopausal and female climacteric states: Secondary | ICD-10-CM | POA: Diagnosis not present

## 2017-10-16 DIAGNOSIS — G4700 Insomnia, unspecified: Secondary | ICD-10-CM | POA: Diagnosis not present

## 2017-10-16 DIAGNOSIS — R6882 Decreased libido: Secondary | ICD-10-CM | POA: Diagnosis not present

## 2017-11-17 DIAGNOSIS — G4700 Insomnia, unspecified: Secondary | ICD-10-CM | POA: Diagnosis not present

## 2017-11-17 DIAGNOSIS — N951 Menopausal and female climacteric states: Secondary | ICD-10-CM | POA: Diagnosis not present

## 2017-11-17 DIAGNOSIS — R6882 Decreased libido: Secondary | ICD-10-CM | POA: Diagnosis not present

## 2017-11-17 DIAGNOSIS — R4586 Emotional lability: Secondary | ICD-10-CM | POA: Diagnosis not present

## 2017-11-20 DIAGNOSIS — F900 Attention-deficit hyperactivity disorder, predominantly inattentive type: Secondary | ICD-10-CM | POA: Diagnosis not present

## 2018-01-02 DIAGNOSIS — E039 Hypothyroidism, unspecified: Secondary | ICD-10-CM | POA: Diagnosis not present

## 2018-01-08 DIAGNOSIS — G4700 Insomnia, unspecified: Secondary | ICD-10-CM | POA: Diagnosis not present

## 2018-01-08 DIAGNOSIS — R7303 Prediabetes: Secondary | ICD-10-CM | POA: Diagnosis not present

## 2018-01-08 DIAGNOSIS — R635 Abnormal weight gain: Secondary | ICD-10-CM | POA: Diagnosis not present

## 2018-01-08 DIAGNOSIS — R6882 Decreased libido: Secondary | ICD-10-CM | POA: Diagnosis not present

## 2018-01-08 DIAGNOSIS — E039 Hypothyroidism, unspecified: Secondary | ICD-10-CM | POA: Diagnosis not present

## 2018-01-10 DIAGNOSIS — R6882 Decreased libido: Secondary | ICD-10-CM | POA: Diagnosis not present

## 2018-01-10 DIAGNOSIS — E039 Hypothyroidism, unspecified: Secondary | ICD-10-CM | POA: Diagnosis not present

## 2018-01-10 DIAGNOSIS — R232 Flushing: Secondary | ICD-10-CM | POA: Diagnosis not present

## 2018-01-10 DIAGNOSIS — N951 Menopausal and female climacteric states: Secondary | ICD-10-CM | POA: Diagnosis not present

## 2018-02-08 DIAGNOSIS — E039 Hypothyroidism, unspecified: Secondary | ICD-10-CM | POA: Diagnosis not present

## 2018-02-08 DIAGNOSIS — R232 Flushing: Secondary | ICD-10-CM | POA: Diagnosis not present

## 2018-02-08 DIAGNOSIS — N951 Menopausal and female climacteric states: Secondary | ICD-10-CM | POA: Diagnosis not present

## 2018-02-08 DIAGNOSIS — R6882 Decreased libido: Secondary | ICD-10-CM | POA: Diagnosis not present

## 2018-02-19 DIAGNOSIS — N951 Menopausal and female climacteric states: Secondary | ICD-10-CM | POA: Diagnosis not present

## 2018-02-19 DIAGNOSIS — R6882 Decreased libido: Secondary | ICD-10-CM | POA: Diagnosis not present

## 2018-02-19 DIAGNOSIS — R232 Flushing: Secondary | ICD-10-CM | POA: Diagnosis not present

## 2018-05-25 MED ORDER — HYDROCORTISONE 2.5 % RE CREA: 1.0000 "application " | TOPICAL_CREAM | Freq: Two times a day (BID) | RECTAL | 1 refills | Status: DC

## 2018-06-04 MED ORDER — VALACYCLOVIR HCL 500 MG PO TABS: 500.0000 mg | ORAL_TABLET | Freq: Two times a day (BID) | ORAL | 0 refills | Status: DC

## 2018-06-04 NOTE — Telephone Encounter (Signed)
Duplicate message  June 04, 2018  Varney Daily, CMA 11:51 AM  I have refilled your medication x 30 days.  Please schedule a follow up for further refills.   You are due for Annual Visit/Physical   -Varney Daily, [place]

## 2018-06-26 ENCOUNTER — Other Ambulatory Visit: Payer: Self-pay | Admitting: Internal Medicine

## 2018-07-02 NOTE — Telephone Encounter (Signed)
Ok to fill? Last filled:06/04/18 Last OV:07/26/17 Please advise  Kerrin Champagne., RMA

## 2018-09-21 ENCOUNTER — Ambulatory Visit: Payer: BLUE CROSS/BLUE SHIELD | Admitting: Family Medicine

## 2018-09-21 ENCOUNTER — Other Ambulatory Visit: Payer: Self-pay

## 2018-09-21 ENCOUNTER — Ambulatory Visit (INDEPENDENT_AMBULATORY_CARE_PROVIDER_SITE_OTHER): Payer: BLUE CROSS/BLUE SHIELD | Admitting: Family Medicine

## 2018-09-21 ENCOUNTER — Encounter: Payer: Self-pay | Admitting: Family Medicine

## 2018-09-21 ENCOUNTER — Ambulatory Visit: Payer: Self-pay

## 2018-09-21 VITALS — BP 92/60 | HR 90 | Temp 98.4°F

## 2018-09-21 DIAGNOSIS — J069 Acute upper respiratory infection, unspecified: Secondary | ICD-10-CM

## 2018-09-21 DIAGNOSIS — M792 Neuralgia and neuritis, unspecified: Secondary | ICD-10-CM

## 2018-09-21 NOTE — Telephone Encounter (Signed)
rec'd call from pt. Reported an intermittent cough for one week with intermittent chest tightness.  Denied fever or shortness of breath.  Also c/o headache in back of head.  Reported she traveled to [place], [place] 3/9-3/14.  Called the office; spoke with [facility]; advised to schedule in Acute clinic this afternoon.  Pt. Requested a later appt. This afternoon, due to her work schedule.  Advised to inform pt., she will receive a prescreening phone call, prior to appt.; she should park in back of building behind orange cones.  Advised to place a mask on, upon arrival to the office. Pt. Verb. Understanding.     Reason for Disposition . [1] Cough occurs AND [2] within 14 days of COVID-19 EXPOSURE  Answer Assessment - Initial Assessment Questions 1. CONFIRMED CASE: "Who is the person with the confirmed COVID-19 infection that you were exposed to?"     Vacationed in [place], [place]  2. PLACE of CONTACT: "Where were you when you were exposed to COVID-19  (coronavirus disease 2019)?" (e.g., city, state, country)     [place] 3. TYPE of CONTACT: "How much contact was there?" (e.g., live in same house, work in same office, same school)     Sight seeing 4. DATE of CONTACT: "When did you have contact with a coronavirus patient?" (e.g., days)     3/9-3/14 5. DURATION of CONTACT: "How long were you in contact with the COVID-19 (coronavirus disease) patient?" (e.g., a few seconds, passed by person, a few minutes, live with the patient)    Spent 5 days there  6. SYMPTOMS: "Do you have any symptoms?" (e.g., fever, cough, breathing difficulty)     Occasional cough with chest tightening during cough and deep breath - started 3/12; headache in back of head to ear; no fever; no shortness of breath 7. PREGNANCY OR POSTPARTUM: "Is there any chance you are pregnant?" "When was your last menstrual period?" "Did you deliver in the last 2 weeks?"    LMP: menopause 8. HIGH RISK: "Do you have any heart or lung problems? Do  you have a weakened immune system?" (e.g., CHF, COPD, asthma, HIV positive, chemotherapy, renal failure, diabetes mellitus, sickle cell anemia)     Has Thyroid disease  Protocols used: CORONAVIRUS (COVID-19) EXPOSURE-A-AH

## 2018-09-23 ENCOUNTER — Encounter: Payer: Self-pay | Admitting: Family Medicine

## 2018-09-23 NOTE — Progress Notes (Signed)
   Subjective:    Patient ID: Toni Bradley, female    DOB: [birth year], 48 y.o.   MRN: [id number]  HPI Here for 2 days of sinus congestion, PND, ST, and a dry cough. No fever. Also she has had one day of intermittent sharp pains thet start on the right side of the back of the head and then radiate to the right temple above the ear. She has had these in the past and they seem to come when she is sick or very stressed. She had been in [place] last weekend sight seeing and visiting family.    Review of Systems  Constitutional: Negative.   HENT: Positive for congestion, postnasal drip, sinus pressure and sore throat.   Eyes: Negative.   Respiratory: Positive for cough.   Musculoskeletal: Negative for myalgias.       Objective:   Physical Exam Constitutional:      Appearance: Normal appearance. She is not ill-appearing.  HENT:     Right Ear: Ear canal and external ear normal.     Left Ear: Ear canal and external ear normal.     Nose: Nose normal.     Mouth/Throat:     Pharynx: Oropharynx is clear.  Eyes:     Conjunctiva/sclera: Conjunctivae normal.  Neck:     Musculoskeletal: Neck supple.  Pulmonary:     Effort: Pulmonary effort is normal. No respiratory distress.     Breath sounds: Normal breath sounds. No stridor. No wheezing, rhonchi or rales.  Musculoskeletal:     Comments: She is tender in a specific spot on the right parietal scalp   Lymphadenopathy:     Cervical: No cervical adenopathy.  Neurological:     Mental Status: She is alert.           Assessment & Plan:  She has a viral URI, and she can drink fluids and take Tylenol prn. She also has a form of neuralgia on the scalp. This seems to be benign and she will follow up if the pain continues.  Alysia Penna, MD

## 2018-09-24 NOTE — Telephone Encounter (Signed)
Pt seen Friday in acute clinic. Nothing further needed.

## 2018-10-08 DIAGNOSIS — Z01419 Encounter for gynecological examination (general) (routine) without abnormal findings: Secondary | ICD-10-CM | POA: Diagnosis not present

## 2018-10-08 DIAGNOSIS — Z6823 Body mass index (BMI) 23.0-23.9, adult: Secondary | ICD-10-CM | POA: Diagnosis not present

## 2018-10-08 DIAGNOSIS — Z1231 Encounter for screening mammogram for malignant neoplasm of breast: Secondary | ICD-10-CM | POA: Diagnosis not present

## 2018-10-11 DIAGNOSIS — N951 Menopausal and female climacteric states: Secondary | ICD-10-CM | POA: Diagnosis not present

## 2018-10-11 DIAGNOSIS — G4700 Insomnia, unspecified: Secondary | ICD-10-CM | POA: Diagnosis not present

## 2018-10-11 DIAGNOSIS — E039 Hypothyroidism, unspecified: Secondary | ICD-10-CM | POA: Diagnosis not present

## 2018-10-17 DIAGNOSIS — E039 Hypothyroidism, unspecified: Secondary | ICD-10-CM | POA: Diagnosis not present

## 2018-10-17 DIAGNOSIS — G4700 Insomnia, unspecified: Secondary | ICD-10-CM | POA: Diagnosis not present

## 2018-10-17 DIAGNOSIS — R232 Flushing: Secondary | ICD-10-CM | POA: Diagnosis not present

## 2018-10-17 DIAGNOSIS — N951 Menopausal and female climacteric states: Secondary | ICD-10-CM | POA: Diagnosis not present

## 2018-10-25 DIAGNOSIS — R4586 Emotional lability: Secondary | ICD-10-CM | POA: Diagnosis not present

## 2018-10-25 DIAGNOSIS — R232 Flushing: Secondary | ICD-10-CM | POA: Diagnosis not present

## 2018-10-25 DIAGNOSIS — N951 Menopausal and female climacteric states: Secondary | ICD-10-CM | POA: Diagnosis not present

## 2018-11-02 DIAGNOSIS — F900 Attention-deficit hyperactivity disorder, predominantly inattentive type: Secondary | ICD-10-CM | POA: Diagnosis not present

## 2018-12-03 DIAGNOSIS — Z03818 Encounter for observation for suspected exposure to other biological agents ruled out: Secondary | ICD-10-CM | POA: Diagnosis not present

## 2018-12-06 ENCOUNTER — Telehealth: Payer: Self-pay | Admitting: Internal Medicine

## 2018-12-06 NOTE — Telephone Encounter (Signed)
Messages regarding other patients should not be created in another pt's chart. This is not in accordance with HIPAA policy.  This message has been copied into the correct chart to be addressed.

## 2018-12-14 DIAGNOSIS — E039 Hypothyroidism, unspecified: Secondary | ICD-10-CM | POA: Diagnosis not present

## 2018-12-31 DIAGNOSIS — M9905 Segmental and somatic dysfunction of pelvic region: Secondary | ICD-10-CM | POA: Diagnosis not present

## 2018-12-31 DIAGNOSIS — M9901 Segmental and somatic dysfunction of cervical region: Secondary | ICD-10-CM | POA: Diagnosis not present

## 2018-12-31 DIAGNOSIS — M9903 Segmental and somatic dysfunction of lumbar region: Secondary | ICD-10-CM | POA: Diagnosis not present

## 2018-12-31 DIAGNOSIS — M9902 Segmental and somatic dysfunction of thoracic region: Secondary | ICD-10-CM | POA: Diagnosis not present

## 2019-01-07 DIAGNOSIS — M9903 Segmental and somatic dysfunction of lumbar region: Secondary | ICD-10-CM | POA: Diagnosis not present

## 2019-01-07 DIAGNOSIS — M9905 Segmental and somatic dysfunction of pelvic region: Secondary | ICD-10-CM | POA: Diagnosis not present

## 2019-01-07 DIAGNOSIS — M9902 Segmental and somatic dysfunction of thoracic region: Secondary | ICD-10-CM | POA: Diagnosis not present

## 2019-01-07 DIAGNOSIS — M9901 Segmental and somatic dysfunction of cervical region: Secondary | ICD-10-CM | POA: Diagnosis not present

## 2019-01-14 DIAGNOSIS — M9901 Segmental and somatic dysfunction of cervical region: Secondary | ICD-10-CM | POA: Diagnosis not present

## 2019-01-14 DIAGNOSIS — M9902 Segmental and somatic dysfunction of thoracic region: Secondary | ICD-10-CM | POA: Diagnosis not present

## 2019-01-14 DIAGNOSIS — M9905 Segmental and somatic dysfunction of pelvic region: Secondary | ICD-10-CM | POA: Diagnosis not present

## 2019-01-14 DIAGNOSIS — M9903 Segmental and somatic dysfunction of lumbar region: Secondary | ICD-10-CM | POA: Diagnosis not present

## 2019-01-25 DIAGNOSIS — M9902 Segmental and somatic dysfunction of thoracic region: Secondary | ICD-10-CM | POA: Diagnosis not present

## 2019-01-25 DIAGNOSIS — M9903 Segmental and somatic dysfunction of lumbar region: Secondary | ICD-10-CM | POA: Diagnosis not present

## 2019-01-25 DIAGNOSIS — M9901 Segmental and somatic dysfunction of cervical region: Secondary | ICD-10-CM | POA: Diagnosis not present

## 2019-01-25 DIAGNOSIS — M9905 Segmental and somatic dysfunction of pelvic region: Secondary | ICD-10-CM | POA: Diagnosis not present

## 2019-02-01 ENCOUNTER — Other Ambulatory Visit: Payer: Self-pay

## 2019-02-01 ENCOUNTER — Telehealth (INDEPENDENT_AMBULATORY_CARE_PROVIDER_SITE_OTHER): Payer: BC Managed Care – PPO | Admitting: Internal Medicine

## 2019-02-01 ENCOUNTER — Encounter: Payer: Self-pay | Admitting: Internal Medicine

## 2019-02-01 DIAGNOSIS — R6889 Other general symptoms and signs: Secondary | ICD-10-CM

## 2019-02-01 NOTE — Progress Notes (Signed)
Virtual Visit via Video Note  I connected with@ on 02/01/19 at  1:15 PM EDT by a video enabled telemedicine application and verified that I am speaking with the correct person using two identifiers. Location patient: home Location provider:work   office Persons participating in the virtual visit: patient, provider  [place] national recommendations  regarding COVID 19 pandemic   video visit is advised over in office visit for this patient.  Patient aware  of the limitations of evaluation and management by telemedicine and decrease  availability of in person appointments.  HPI: Toni Bradley presents for video visit Days  Onset of pressure  In area ant neck that feels like pressure but no dysphagia  Vomiting full heart burn cough  Not sure if could be  Reflux . Tried otc generic ?antacid  ? If  Ok sx off and .   Just concerned about  New sx  No hx choking cp sob  Had acupenture last week  Neck and arm    ROS: See pertinent positives and negatives per HPI.no fever weight loss   Past Medical History:  Diagnosis Date  . Dry skin   . Mood disorder ([place])   . Nasal polyps   . Problems related to lack of adequate sleep   . Recurrent HSV (herpes simplex virus)     Past Surgical History:  Procedure Laterality Date  . childbirth  1998   vaginal  . NASAL POLYP SURGERY     . ovarian cyst sx  1996    Family History  Problem Relation Age of Onset  . Diabetes Other   . Thyroid disease Other        Sister  . Other Father        Parathyroid  . Breast cancer Sister     Social History   Tobacco Use  . Smoking status: Never Smoker  . Smokeless tobacco: Never Used  Substance Use Topics  . Alcohol use: Yes    Comment: occ.  . Drug use: No      Current Outpatient Medications:  .  ARMOUR THYROID 15 MG tablet, TAKE 1 TABLET BY MOUTH EVERY MORNING ON AN EMPTY STOMACH, Disp: , Rfl: 6 .  estradiol (ESTRACE) 2 MG tablet, Take 2 mg by mouth daily., Disp: , Rfl: 12 .   HYDROcodone-homatropine (HYCODAN) 5-1.5 MG/5ML syrup, Take 5 mLs by mouth every 8 (eight) hours as needed for cough., Disp: 90 mL, Rfl: 0 .  hydrocortisone (ANUSOL-HC) 2.5 % rectal cream, Apply 1 application topically 2 (two) times daily. Rectal area no more than 2 weeks, Disp: 30 g, Rfl: 1 .  methylphenidate (CONCERTA) 36 MG PO CR tablet, Take 1 tablet (36 mg total) by mouth every morning., Disp: 30 tablet, Rfl: 0 .  progesterone (PROMETRIUM) 100 MG capsule, TAKE 2 CAPSULES BY MOUTH EVERY NIGHT, Disp: , Rfl: 1 .  promethazine (PHENERGAN) 25 MG tablet, Take 1 tablet (25 mg total) by mouth every 4 (four) hours as needed for nausea or vomiting., Disp: 12 tablet, Rfl: 0 .  valACYclovir (VALTREX) 500 MG tablet, TAKE 1 TABLET BY MOUTH TWICE A DAY, Disp: 60 tablet, Rfl: 0  EXAM: BP Readings from Last 3 Encounters:  09/21/18 92/60  07/26/17 90/60  12/06/16 100/62    VITALS per patient if applicable:  GENERAL: alert, oriented, appears well and in no acute distress HEENT: atraumatic, conjunttiva clear, no obvious abnormalities on inspection of external nose and ears NECK: normal movements of the head and neck points  to area suprasternal notch no obv mass effect   LUNGS: on inspection no signs of respiratory distress, breathing rate appears normal, no obvious gross SOB, gasping or wheezing CV: no obvious cyanosis MS: moves all visible extremities without noticeable abnormality PSYCH/NEURO: pleasant and cooperative, no obvious depression or anxiety, speech and thought processing grossly intact Lab Results  Component Value Date   WBC 7.8 12/06/2016   HGB 13.4 12/06/2016   HCT 39.7 12/06/2016   PLT 287.0 12/06/2016   GLUCOSE 87 12/06/2016   CHOL 171 12/06/2016   TRIG 117.0 12/06/2016   HDL 62.40 12/06/2016   LDLCALC 86 12/06/2016   ALT 17 12/06/2016   AST 20 12/06/2016   NA 141 12/06/2016   K 4.1 12/06/2016   CL 103 12/06/2016   CREATININE 0.70 12/06/2016   BUN 11 12/06/2016   CO2 30  12/06/2016   TSH 2.67 12/06/2016     ASSESSMENT AND PLAN:  Discussed the following assessment and plan:    ICD-10-CM   1. Throat symptom  R68.89    Could be EHR or gi sx   Doubt from thyroid and no alarm sx x this is new . Trial pepcid bid  20 mg OTC  For 2 weeks or less and contact if ongoing update .  Could be a version of stress   Follow   Counseled.   Expectant management and discussion of plan and treatment with opportunity to ask questions and all were answered. The patient agreed with the plan and demonstrated an understanding of the instructions.   Advised to call back or seek an in-person evaluation if worsening  or having  further concerns . In interim      Shanon Ace, MD

## 2019-02-05 DIAGNOSIS — M9905 Segmental and somatic dysfunction of pelvic region: Secondary | ICD-10-CM | POA: Diagnosis not present

## 2019-02-05 DIAGNOSIS — M9903 Segmental and somatic dysfunction of lumbar region: Secondary | ICD-10-CM | POA: Diagnosis not present

## 2019-02-05 DIAGNOSIS — M9901 Segmental and somatic dysfunction of cervical region: Secondary | ICD-10-CM | POA: Diagnosis not present

## 2019-02-05 DIAGNOSIS — M9902 Segmental and somatic dysfunction of thoracic region: Secondary | ICD-10-CM | POA: Diagnosis not present

## 2019-02-13 DIAGNOSIS — M67911 Unspecified disorder of synovium and tendon, right shoulder: Secondary | ICD-10-CM | POA: Diagnosis not present

## 2019-02-13 DIAGNOSIS — M25511 Pain in right shoulder: Secondary | ICD-10-CM | POA: Diagnosis not present

## 2019-02-20 DIAGNOSIS — L821 Other seborrheic keratosis: Secondary | ICD-10-CM | POA: Diagnosis not present

## 2019-02-20 DIAGNOSIS — L820 Inflamed seborrheic keratosis: Secondary | ICD-10-CM | POA: Diagnosis not present

## 2019-02-20 DIAGNOSIS — D225 Melanocytic nevi of trunk: Secondary | ICD-10-CM | POA: Diagnosis not present

## 2019-03-04 NOTE — Telephone Encounter (Signed)
Okay to write a letter stating   To whom it may concern,   Toni Bradley is a primary care patient of mine. Due to underlying medical condition, I have recommended regular exercise for patient. Please allow patient to exercise at your facility. I have asked patient to try to keep 9-12 feet from other people while exercising and also to wear a mask.   Thanks,   Shanon Ace, MD ?

## 2019-03-05 NOTE — Telephone Encounter (Signed)
Yes  Ok to do this letter   As written   Thanks sending this to both of you

## 2019-03-06 NOTE — Telephone Encounter (Signed)
Letter has been sent to patient via mychart.

## 2019-03-15 DIAGNOSIS — N951 Menopausal and female climacteric states: Secondary | ICD-10-CM | POA: Diagnosis not present

## 2019-03-15 DIAGNOSIS — E039 Hypothyroidism, unspecified: Secondary | ICD-10-CM | POA: Diagnosis not present

## 2019-03-22 DIAGNOSIS — E039 Hypothyroidism, unspecified: Secondary | ICD-10-CM | POA: Diagnosis not present

## 2019-03-22 DIAGNOSIS — R232 Flushing: Secondary | ICD-10-CM | POA: Diagnosis not present

## 2019-03-22 DIAGNOSIS — R6882 Decreased libido: Secondary | ICD-10-CM | POA: Diagnosis not present

## 2019-03-22 DIAGNOSIS — N951 Menopausal and female climacteric states: Secondary | ICD-10-CM | POA: Diagnosis not present

## 2019-04-23 DIAGNOSIS — L270 Generalized skin eruption due to drugs and medicaments taken internally: Secondary | ICD-10-CM | POA: Diagnosis not present

## 2019-05-10 DIAGNOSIS — F900 Attention-deficit hyperactivity disorder, predominantly inattentive type: Secondary | ICD-10-CM | POA: Diagnosis not present

## 2019-05-20 ENCOUNTER — Other Ambulatory Visit: Payer: Self-pay

## 2019-05-20 ENCOUNTER — Ambulatory Visit (INDEPENDENT_AMBULATORY_CARE_PROVIDER_SITE_OTHER): Payer: BC Managed Care – PPO | Admitting: Internal Medicine

## 2019-05-20 ENCOUNTER — Encounter: Payer: Self-pay | Admitting: Internal Medicine

## 2019-05-20 VITALS — BP 110/62 | HR 96 | Temp 97.7°F | Ht 62.5 in | Wt 134.2 lb

## 2019-05-20 DIAGNOSIS — E063 Autoimmune thyroiditis: Secondary | ICD-10-CM | POA: Diagnosis not present

## 2019-05-20 DIAGNOSIS — Z0000 Encounter for general adult medical examination without abnormal findings: Secondary | ICD-10-CM

## 2019-05-20 DIAGNOSIS — R0981 Nasal congestion: Secondary | ICD-10-CM

## 2019-05-20 DIAGNOSIS — Z79899 Other long term (current) drug therapy: Secondary | ICD-10-CM

## 2019-05-20 DIAGNOSIS — E038 Other specified hypothyroidism: Secondary | ICD-10-CM

## 2019-05-20 DIAGNOSIS — M25511 Pain in right shoulder: Secondary | ICD-10-CM

## 2019-05-20 DIAGNOSIS — Z1211 Encounter for screening for malignant neoplasm of colon: Secondary | ICD-10-CM | POA: Diagnosis not present

## 2019-05-20 LAB — CBC WITH DIFFERENTIAL/PLATELET
Basophils Absolute: 0 10*3/uL (ref 0.0–0.1)
Basophils Relative: 0.4 % (ref 0.0–3.0)
Eosinophils Absolute: 0.3 10*3/uL (ref 0.0–0.7)
Eosinophils Relative: 4.7 % (ref 0.0–5.0)
HCT: 42.4 % (ref 36.0–46.0)
Hemoglobin: 14.3 g/dL (ref 12.0–15.0)
Lymphocytes Relative: 33.7 % (ref 12.0–46.0)
Lymphs Abs: 2.5 10*3/uL (ref 0.7–4.0)
MCHC: 33.8 g/dL (ref 30.0–36.0)
MCV: 94.8 fl (ref 78.0–100.0)
Monocytes Absolute: 0.4 10*3/uL (ref 0.1–1.0)
Monocytes Relative: 5.7 % (ref 3.0–12.0)
Neutro Abs: 4 10*3/uL (ref 1.4–7.7)
Neutrophils Relative %: 55.5 % (ref 43.0–77.0)
Platelets: 293 10*3/uL (ref 150.0–400.0)
RBC: 4.47 Mil/uL (ref 3.87–5.11)
RDW: 12.4 % (ref 11.5–15.5)
WBC: 7.3 10*3/uL (ref 4.0–10.5)

## 2019-05-20 LAB — HEPATIC FUNCTION PANEL
ALT: 16 U/L (ref 0–35)
AST: 22 U/L (ref 0–37)
Albumin: 4.7 g/dL (ref 3.5–5.2)
Alkaline Phosphatase: 55 U/L (ref 39–117)
Bilirubin, Direct: 0.1 mg/dL (ref 0.0–0.3)
Total Bilirubin: 0.4 mg/dL (ref 0.2–1.2)
Total Protein: 7.3 g/dL (ref 6.0–8.3)

## 2019-05-20 LAB — BASIC METABOLIC PANEL
BUN: 14 mg/dL (ref 6–23)
CO2: 30 mEq/L (ref 19–32)
Calcium: 10 mg/dL (ref 8.4–10.5)
Chloride: 100 mEq/L (ref 96–112)
Creatinine, Ser: 0.96 mg/dL (ref 0.40–1.20)
GFR: 62.27 mL/min (ref >60.00)
Glucose, Bld: 96 mg/dL (ref 70–99)
Potassium: 4.1 mEq/L (ref 3.5–5.1)
Sodium: 140 mEq/L (ref 135–145)

## 2019-05-20 LAB — LIPID PANEL
Cholesterol: 203 mg/dL — ABNORMAL HIGH (ref 0–200)
HDL: 63.8 mg/dL (ref >39.00)
LDL Cholesterol: 124 mg/dL — ABNORMAL HIGH (ref 0–99)
NonHDL: 139.59
Total CHOL/HDL Ratio: 3
Triglycerides: 80 mg/dL (ref 0.0–149.0)
VLDL: 16 mg/dL (ref 0.0–40.0)

## 2019-05-20 LAB — C-REACTIVE PROTEIN: CRP: <1 mg/dL (ref 0.5–20.0)

## 2019-05-20 LAB — T4, FREE: Free T4: 0.69 ng/dL (ref 0.60–1.60)

## 2019-05-20 LAB — TSH: TSH: 1.08 u[IU]/mL (ref 0.35–4.50)

## 2019-05-20 MED ORDER — IPRATROPIUM BROMIDE 0.06 % NA SOLN: NASAL | 12 refills | Status: DC

## 2019-05-20 NOTE — Progress Notes (Signed)
Chief Complaint  Patient presents with   Annual Exam    pt is having shoulder pain wants to discuss thyroid and vertigo     HPI: Patient  Toni Bradley  48 y.o. comes in today for [place] visit   concerns and labs to be done  GYNE   THyroid   Dr Chalmers Cater  Last labs here 2018   bio identical pellets       beig used for her early menopause   Last tsh in September .  Right shoulder pain problematic since  Over 6 mos  Has see  chiro   Dx  [place]    poct negative And  CS shot.   [facility]   Acupuncture  And still bothersome  Stretching exercises help continues resistance training with less weight  Not sure if shoulder or a nerve?   No weakness    Health Maintenance  Topic Date Due   HIV Screening  04/17/1987   INFLUENZA VACCINE  10/02/2019 (Originally 02/02/2019)   PAP SMEAR-Modifier  09/18/2019   TETANUS/TDAP  12/07/2026   Health Maintenance Review LIFESTYLE:  Exercise:   Back at gyme  3-4 d  Lift and 2-3  Cardio.  Tobacco/ETS: no Alcohol:   ocass Sugar beverages: Sleep:  6-7  Drug use: no HH of  4   Work:  mmany hours     [employer]     ROS:  Gets stuffy nose in salon  Flonase no help  Gets ocass positional vertigo dizziness no falling syncope other  GEN/ HEENT: No fever, significant weight changes sweats headaches vision problems hearing changes, CV/ PULM; No chest pain shortness of breath cough, syncope,edema  change in exercise tolerance. GI /GU: No adominal pain, vomiting, change in bowel habits. No blood in the stool. No significant GU symptoms. SKIN/HEME: ,no acute skin rashes suspicious lesions or bleeding. No lymphadenopathy, nodules, masses.  NEURO/ PSYCH:  No neurologic signs such as weakness numbness. No depression anxiety. IMM/ Allergy: No unusual infections.  Allergy .   REST of 12 system review negative except as per HPI   Past Medical History:  Diagnosis Date   Dry skin    Mood disorder ([place])    Nasal polyps    Problems related  to lack of adequate sleep    Recurrent HSV (herpes simplex virus)     Past Surgical History:  Procedure Laterality Date   childbirth  1998   vaginal   NASAL POLYP SURGERY      ovarian cyst sx  1996    Family History  Problem Relation Age of Onset   Diabetes Other    Thyroid disease Other        Sister   Other Father        Parathyroid   Breast cancer Sister     Social History   Socioeconomic History   Marital status: Married    Spouse name: Not on file   Number of children: Not on file   Years of education: Not on file   Highest education level: Not on file  Occupational History   Not on file  Social Needs   Financial resource strain: Not on file   Food insecurity    Worry: Not on file    Inability: Not on file   Transportation needs    Medical: Not on file    Non-medical: Not on file  Tobacco Use   Smoking status: Never Smoker   Smokeless tobacco: Never  Used  Substance and Sexual Activity   Alcohol use: Yes    Comment: occ.   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    No ets.    Work out 5 x per week.    6-7 hours              Outpatient Medications Prior to Visit  Medication Sig Dispense Refill   ARMOUR THYROID 15 MG tablet 60 mg.   6   hydrocortisone (ANUSOL-HC) 2.5 % rectal cream Apply 1 application topically 2 (two) times daily. Rectal area no more than 2  weeks 30 g 1   methylphenidate (CONCERTA) 36 MG PO CR tablet Take 1 tablet (36 mg total) by mouth every morning. 30 tablet 0   progesterone (PROMETRIUM) 100 MG capsule TAKE 2 CAPSULES BY MOUTH EVERY NIGHT  1   valACYclovir (VALTREX) 500 MG tablet TAKE 1 TABLET BY MOUTH TWICE A DAY 60 tablet 0   promethazine (PHENERGAN) 25 MG tablet Take 1 tablet (25 mg total) by mouth every 4 (four) hours as needed for nausea or vomiting. (Patient not taking: Reported on 05/20/2019) 12 tablet 0   estradiol (ESTRACE) 2 MG tablet Take 2 mg by mouth daily.  12   HYDROcodone-homatropine (HYCODAN) 5-1.5 MG/5ML syrup Take 5 mLs by mouth every 8 (eight) hours as needed for cough. (Patient not taking: Reported on 05/20/2019) 90 mL 0   No facility-administered medications prior to visit.      EXAM:  BP 110/62 (BP Location: Right Arm, Patient Position: Sitting, Cuff Size: Normal)    Pulse 96    Temp 97.7 F (36.5 C) (Temporal)    Ht 5' 2.5" (1.588 m)    Wt 134 lb 3.2 oz (60.9 kg)    SpO2 98%    BMI 24.15 kg/m   Body mass index is 24.15 kg/m. Wt Readings from Last 3 Encounters:  05/20/19 134 lb 3.2 oz (60.9 kg)  07/26/17 134 lb 12.8 oz (61.1 kg)  12/06/16 132 lb 3.2 oz (60 kg)    Physical Exam: Vital signs reviewed [id number] is a well-developed well-nourished alert cooperative    who appearsr stated age in no acute distress.  HEENT: normocephalic atraumatic , Eyes: PERRL EOM's full, conjunctiva clear, Nares: paten,t no deformity discharge or tenderness., Ears: no deformity EAC's clear TMs with normal landmarks. Left some wax  Mouth: clear OP, not masked gets anxious with mask  But no eexam  today NECK: supple without masses, thyromegaly or bruits. CHEST/PULM:  Clear to auscultation and percussion breath sounds equal no wheeze , rales or rhonchi. No chest wall deformities or tenderness. Breast: normal by inspection . No dimpling, discharge, masses, tenderness or discharge . CV: PMI is nondisplaced, S1  S2 no  gallops, murmurs, rubs. Peripheral pulses are full without delay.No JVD .  ABDOMEN: Bowel sounds normal nontender  No guard or rebound, no hepato splenomegal no CVA tenderness.   Extremtities:  No clubbing cyanosis or edema, no acute joint swelling or redness no focal atrophy NEURO:  Oriented x3, cranial nerves 3-12 appear to be intact, no obvious focal weakness,gait within normal limits no abnormal reflexes or asymmetrical SKIN: No acute rashes normal turgor, color, no bruising or petechiae. PSYCH: Oriented, good eye contact, no obvious depression anxiety, cognition and judgment appear normal. LN: no cervical axillary inguinal adenopathy  Lab Results  Component Value Date   WBC 7.3 05/20/2019   HGB 14.3 05/20/2019   HCT 42.4 05/20/2019   PLT 293.0 05/20/2019   GLUCOSE 96 05/20/2019   CHOL 203 (H) 05/20/2019   TRIG 80.0 05/20/2019   HDL 63.80 05/20/2019   LDLCALC 124 (H) 05/20/2019   ALT 16 05/20/2019   AST 22 05/20/2019   NA 140 05/20/2019   K 4.1 05/20/2019   CL 100 05/20/2019   CREATININE 0.96 05/20/2019   BUN 14 05/20/2019   CO2 30 05/20/2019   TSH 1.08 05/20/2019    BP Readings from Last 3 Encounters:  05/20/19 110/62  09/21/18 92/60  07/26/17 90/60    Lab results reviewed with patient  Turkeybacon and oatmeal and  blueberries  .  ASSESSMENT AND PLAN:  Discussed the following assessment and plan:    ICD-10-CM   1. Visit for preventive health examination  123456 Basic metabolic panel    CBC with Differential    Hepatic function panel    Lipid panel    TSH    T4, Free (Thyrox)    Thyroid antibodies 6 mos    C-reactive protein  2. Medication management  123456 Basic metabolic panel    CBC with Differential    Hepatic function panel    Lipid panel    TSH    T4, Free (Thyrox)    Thyroid antibodies 6 mos    C-reactive protein  3. Hypothyroidism due to Hashimoto's thyroiditis  99991111 Basic metabolic panel   0000000 CBC with Differential    Hepatic function  panel    Lipid panel    TSH    T4, Free (Thyrox)    Thyroid antibodies 6 mos    C-reactive protein  4. Right shoulder pain, unspecified chronicity  [id number] Basic metabolic panel    CBC with Differential    Hepatic function panel    Lipid panel    TSH    T4, Free (Thyrox)    Thyroid antibodies 6 mos    C-reactive protein  5. Colon cancer screening  Z12.11 [place] [facility] colonosc screening referral  6. Nasal congestion reactive   R09.81    ? reacive  ? VMR try atrovent hs nasal polyps    Asks about colon screening   Neg fam hx but  With  recnet updated recs  Will do referral  Concerned about shoulder still seems   Ms related  Disc path of going back to ortho sm first  Ice after activity and adapt her work out   For the right  Shoulder ( she may working through it cause it hurts but  My benefit from technique changes)   Hx of postional vertigo and if  Problematic consider  Rehab pt for this  She will let us know  Patient Care Team: Tyra Michelle, [name], MD as PCP - General (Internal Medicine) Louretta Shorten,  MD as Consulting Physician (Obstetrics and Gynecology) Jacelyn Pi, MD as Consulting Physician (Endocrinology) Patient Instructions  Can try   neew nose spray .   Will refer for  Colonoscopy.   Fu about the shoulder as we discussed   Ice after  Work out.     Health Maintenance, Female Adopting a healthy lifestyle and getting preventive care are important in promoting health and wellness. Ask your health care provider about:  The right schedule for you to have regular tests and exams.  Things you can do on your own to prevent diseases and keep yourself healthy. What should I know about diet, weight, and exercise? Eat a healthy diet   Eat a diet that includes plenty of vegetables, fruits, low-fat dairy products, and lean protein.  Do not eat a lot of foods that are high in solid fats, added sugars, or sodium. Maintain a healthy weight Body mass index (BMI) is used to identify  weight problems. It estimates body fat based on height and weight. Your health care provider can help determine your BMI and help you achieve or maintain a healthy weight. Get regular exercise Get regular exercise. This is one of the most important things you can do for your health. Most adults should:  Exercise for at least 150 minutes each week. The exercise should increase your heart rate and make you sweat (moderate-intensity exercise).  Do strengthening exercises at least twice a week. This is in addition to the moderate-intensity exercise.  Spend less time sitting. Even light physical activity can be beneficial. Watch cholesterol and blood lipids Have your blood tested for lipids and cholesterol at 47 years of age, then have this test every 5 years. Have your cholesterol levels checked more often if:  Your lipid or cholesterol levels are high.  You are older than 47 years of age.  You are at high risk for heart disease. What should I know about cancer screening? Depending on your health history and family history, you may need to have cancer screening at various ages. This may include screening for:  Breast cancer.  Cervical cancer.  Colorectal cancer.  Skin cancer.  Lung cancer. What should I know about heart disease, diabetes, and high blood pressure? Blood pressure and heart disease  High blood pressure causes heart disease and increases the risk of stroke. This is more likely to develop in people who have high blood pressure readings, are of African descent, or are overweight.  Have your blood pressure checked: ? Every 3-5 years if you are 33-80 years of age. ? Every year if you are 73 years old or older. Diabetes Have regular diabetes screenings. This checks your fasting blood sugar level. Have the screening done:  Once every three years after age 13 if you are at a normal weight and have a low risk for diabetes.  More often and at a younger age if you are  overweight or have a high risk for diabetes. What should I know about preventing infection? Hepatitis B If you have a higher risk for hepatitis B, you should be screened for this virus. Talk with your health care provider to find out if you are at risk for hepatitis B infection. Hepatitis C Testing is recommended for:  Everyone born from [birth year] through 1965.  Anyone with known risk factors for hepatitis C. Sexually transmitted infections (STIs)  Get screened for STIs, including gonorrhea and chlamydia, if: ? You are sexually active and are younger than 47 years of age. ?  You are older than 47 years of age and your health care provider tells you that you are at risk for this type of infection. ? Your sexual activity has changed since you were last screened, and you are at increased risk for chlamydia or gonorrhea. Ask your health care provider if you are at risk.  Ask your health care provider about whether you are at high risk for HIV. Your health care provider may recommend a prescription medicine to help prevent HIV infection. If you choose to take medicine to prevent HIV, you should first get tested for HIV. You should then be tested every 3 months for as long as you are taking the medicine. Pregnancy  If you are about to stop having your period (premenopausal) and you may become pregnant, seek counseling before you get pregnant.  Take 400 to 800 micrograms (mcg) of folic acid every day if you become pregnant.  Ask for birth control (contraception) if you want to prevent pregnancy. Osteoporosis and menopause Osteoporosis is a disease in which the bones lose minerals and strength with aging. This can result in bone fractures. If you are 91 years old or older, or if you are at risk for osteoporosis and fractures, ask your health care provider if you should:  Be screened for bone loss.  Take a calcium or vitamin D supplement to lower your risk of fractures.  Be given hormone replacement  therapy (HRT) to treat symptoms of menopause. Follow these instructions at home: Lifestyle  Do not use any products that contain nicotine or tobacco, such as cigarettes, e-cigarettes, and chewing tobacco. If you need help quitting, ask your health care provider.  Do not use street drugs.  Do not share needles.  Ask your health care provider for help if you need support or information about quitting drugs. Alcohol use  Do not drink alcohol if: ? Your health care provider tells you not to drink. ? You are pregnant, may be pregnant, or are planning to become pregnant.  If you drink alcohol: ? Limit how much you use to 0-1 drink a day. ? Limit intake if you are breastfeeding.  Be aware of how much alcohol is in your drink. In the [place] one drink equals one 12 oz bottle of beer (355 mL), one 5 oz glass of wine (148 mL), or one 1 oz glass of hard liquor (44 mL). General instructions  Schedule regular health, dental, and eye exams.  Stay current with your vaccines.  Tell your health care provider if: ? You often feel depressed. ? You have ever been abused or do not feel safe at home. Summary  Adopting a healthy lifestyle and getting preventive care are important in promoting health and wellness.  Follow your health care provider's instructions about healthy diet, exercising, and getting tested or screened for diseases.  Follow your health care provider's instructions on monitoring your cholesterol and blood pressure. This information is not intended to replace advice given to you by your health care provider. Make sure you discuss any questions you have with your health care provider. Document Released: 01/03/2011 Document Revised: 06/13/2018 Document Reviewed: 06/13/2018 Elsevier Patient Education  2020 Chamberlayne Braedan Meuth M.D.

## 2019-05-20 NOTE — Patient Instructions (Addendum)
Can try   neew nose spray .   Will refer for  Colonoscopy.   Fu about the shoulder as we discussed   Ice after  Work out.     Health Maintenance, Female Adopting a healthy lifestyle and getting preventive care are important in promoting health and wellness. Ask your health care provider about:  The right schedule for you to have regular tests and exams.  Things you can do on your own to prevent diseases and keep yourself healthy. What should I know about diet, weight, and exercise? Eat a healthy diet   Eat a diet that includes plenty of vegetables, fruits, low-fat dairy products, and lean protein.  Do not eat a lot of foods that are high in solid fats, added sugars, or sodium. Maintain a healthy weight Body mass index (BMI) is used to identify weight problems. It estimates body fat based on height and weight. Your health care provider can help determine your BMI and help you achieve or maintain a healthy weight. Get regular exercise Get regular exercise. This is one of the most important things you can do for your health. Most adults should:  Exercise for at least 150 minutes each week. The exercise should increase your heart rate and make you sweat (moderate-intensity exercise).  Do strengthening exercises at least twice a week. This is in addition to the moderate-intensity exercise.  Spend less time sitting. Even light physical activity can be beneficial. Watch cholesterol and blood lipids Have your blood tested for lipids and cholesterol at 47 years of age, then have this test every 5 years. Have your cholesterol levels checked more often if:  Your lipid or cholesterol levels are high.  You are older than 47 years of age.  You are at high risk for heart disease. What should I know about cancer screening? Depending on your health history and family history, you may need to have cancer screening at various ages. This may include screening for:  Breast cancer.  Cervical  cancer.  Colorectal cancer.  Skin cancer.  Lung cancer. What should I know about heart disease, diabetes, and high blood pressure? Blood pressure and heart disease  High blood pressure causes heart disease and increases the risk of stroke. This is more likely to develop in people who have high blood pressure readings, are of African descent, or are overweight.  Have your blood pressure checked: ? Every 3-5 years if you are 56-12 years of age. ? Every year if you are 63 years old or older. Diabetes Have regular diabetes screenings. This checks your fasting blood sugar level. Have the screening done:  Once every three years after age 59 if you are at a normal weight and have a low risk for diabetes.  More often and at a younger age if you are overweight or have a high risk for diabetes. What should I know about preventing infection? Hepatitis B If you have a higher risk for hepatitis B, you should be screened for this virus. Talk with your health care provider to find out if you are at risk for hepatitis B infection. Hepatitis C Testing is recommended for:  Everyone born from [birth year] through 1965.  Anyone with known risk factors for hepatitis C. Sexually transmitted infections (STIs)  Get screened for STIs, including gonorrhea and chlamydia, if: ? You are sexually active and are younger than 47 years of age. ? You are older than 47 years of age and your health care provider tells you that you  are at risk for this type of infection. ? Your sexual activity has changed since you were last screened, and you are at increased risk for chlamydia or gonorrhea. Ask your health care provider if you are at risk.  Ask your health care provider about whether you are at high risk for HIV. Your health care provider may recommend a prescription medicine to help prevent HIV infection. If you choose to take medicine to prevent HIV, you should first get tested for HIV. You should then be tested every 3  months for as long as you are taking the medicine. Pregnancy  If you are about to stop having your period (premenopausal) and you may become pregnant, seek counseling before you get pregnant.  Take 400 to 800 micrograms (mcg) of folic acid every day if you become pregnant.  Ask for birth control (contraception) if you want to prevent pregnancy. Osteoporosis and menopause Osteoporosis is a disease in which the bones lose minerals and strength with aging. This can result in bone fractures. If you are 98 years old or older, or if you are at risk for osteoporosis and fractures, ask your health care provider if you should:  Be screened for bone loss.  Take a calcium or vitamin D supplement to lower your risk of fractures.  Be given hormone replacement therapy (HRT) to treat symptoms of menopause. Follow these instructions at home: Lifestyle  Do not use any products that contain nicotine or tobacco, such as cigarettes, e-cigarettes, and chewing tobacco. If you need help quitting, ask your health care provider.  Do not use street drugs.  Do not share needles.  Ask your health care provider for help if you need support or information about quitting drugs. Alcohol use  Do not drink alcohol if: ? Your health care provider tells you not to drink. ? You are pregnant, may be pregnant, or are planning to become pregnant.  If you drink alcohol: ? Limit how much you use to 0-1 drink a day. ? Limit intake if you are breastfeeding.  Be aware of how much alcohol is in your drink. In the [place] one drink equals one 12 oz bottle of beer (355 mL), one 5 oz glass of wine (148 mL), or one 1 oz glass of hard liquor (44 mL). General instructions  Schedule regular health, dental, and eye exams.  Stay current with your vaccines.  Tell your health care provider if: ? You often feel depressed. ? You have ever been abused or do not feel safe at home. Summary  Adopting a healthy lifestyle and getting  preventive care are important in promoting health and wellness.  Follow your health care provider's instructions about healthy diet, exercising, and getting tested or screened for diseases.  Follow your health care provider's instructions on monitoring your cholesterol and blood pressure. This information is not intended to replace advice given to you by your health care provider. Make sure you discuss any questions you have with your health care provider. Document Released: 01/03/2011 Document Revised: 06/13/2018 Document Reviewed: 06/13/2018 Elsevier Patient Education  2020 [employer].

## 2019-05-21 DIAGNOSIS — E785 Hyperlipidemia, unspecified: Secondary | ICD-10-CM

## 2019-05-21 LAB — THYROID ANTIBODIES
Thyroglobulin Ab: <1 IU/mL (ref <=1)
Thyroperoxidase Ab SerPl-aCnc: 18 IU/mL — ABNORMAL HIGH (ref <9)

## 2019-05-22 NOTE — Telephone Encounter (Signed)
I suggest    Waiting  1-2 months and do the test fasting( water and meds ok)  But ok to  Repeat lipid panel   Dx hld

## 2019-06-03 DIAGNOSIS — R635 Abnormal weight gain: Secondary | ICD-10-CM | POA: Diagnosis not present

## 2019-06-03 DIAGNOSIS — G4700 Insomnia, unspecified: Secondary | ICD-10-CM | POA: Diagnosis not present

## 2019-06-03 DIAGNOSIS — N959 Unspecified menopausal and perimenopausal disorder: Secondary | ICD-10-CM | POA: Diagnosis not present

## 2019-06-03 DIAGNOSIS — E559 Vitamin D deficiency, unspecified: Secondary | ICD-10-CM | POA: Diagnosis not present

## 2019-06-18 DIAGNOSIS — D1801 Hemangioma of skin and subcutaneous tissue: Secondary | ICD-10-CM | POA: Diagnosis not present

## 2019-06-18 DIAGNOSIS — D225 Melanocytic nevi of trunk: Secondary | ICD-10-CM | POA: Diagnosis not present

## 2019-06-18 DIAGNOSIS — L309 Dermatitis, unspecified: Secondary | ICD-10-CM | POA: Diagnosis not present

## 2019-07-11 DIAGNOSIS — L309 Dermatitis, unspecified: Secondary | ICD-10-CM | POA: Diagnosis not present

## 2019-07-11 DIAGNOSIS — D485 Neoplasm of uncertain behavior of skin: Secondary | ICD-10-CM | POA: Diagnosis not present

## 2019-07-11 DIAGNOSIS — L989 Disorder of the skin and subcutaneous tissue, unspecified: Secondary | ICD-10-CM | POA: Diagnosis not present

## 2019-07-15 ENCOUNTER — Encounter: Payer: Self-pay | Admitting: Internal Medicine

## 2019-07-18 ENCOUNTER — Telehealth: Payer: BC Managed Care – PPO | Admitting: Physician Assistant

## 2019-07-18 ENCOUNTER — Telehealth: Payer: Self-pay | Admitting: *Deleted

## 2019-07-18 DIAGNOSIS — R21 Rash and other nonspecific skin eruption: Secondary | ICD-10-CM

## 2019-07-18 NOTE — Progress Notes (Signed)
Based on what you shared with me, I feel your condition warrants further evaluation and I recommend that you be seen for a face to face office visit.  The reason I am concerned is that the rash is very near your eye and it is difficult to tell from the picture whether this rash is vesicular in nature. I think that you need to be seen for an in person visit to confirm if this is truly contact dermatitis versus something else such as zoster ophthalmicus as that would be much more serious and would require different treatment.    NOTE: If you entered your credit card information for this eVisit, you will not be charged. You may see a "hold" on your card for the $35 but that hold will drop off and you will not have a charge processed.   If you are having a true medical emergency please call 911.      For an urgent face to face visit, [facility] has five urgent care centers for your convenience:      NEW:  [facility] [place] at [place] Get Driving Directions [id number] [place], [place] [address] . 10 am - 6pm Monday - Friday    [facility]) Get Driving Directions [id number] [address] [place], [place] [address] . 10 am to 8 pm Monday-Friday . 12 pm to 8 pm [facility] Get Driving Directions [id number] [place], [place], [place] [address] . 8 am to 8 pm Monday-Friday . 9 am to 6 pm Saturday . 11 am to 6 pm Sunday     [facility] at [facility] Get Driving Directions  [id number] [address] [place], [place] [address] . 8 am to 8 pm Monday-Friday . 8 am to 4 pm [facility] at [place] Get Driving Directions [id number] [address] [place],  [address] . 12 pm to 6 pm Monday-Friday      Your e-visit answers were reviewed by a board  certified advanced clinical practitioner to complete your personal care plan.  Thank you for using e-Visits.   Greater than 5 minutes, yet less than 10 minutes of time have been spend researching, coordinating, and implementing care for this patient today.

## 2019-07-18 NOTE — Telephone Encounter (Signed)
Copied from [place] [id number]. Topic: General - Other >> Jul 18, 2019  2:12 PM Wynetta Emery, [place] C wrote: Reason for CRM: pt is calling in to schedule a visit with a provider for a rash, please assist pt with scheduling.   CB:

## 2019-07-19 ENCOUNTER — Telehealth (INDEPENDENT_AMBULATORY_CARE_PROVIDER_SITE_OTHER): Payer: BC Managed Care – PPO | Admitting: Internal Medicine

## 2019-07-19 ENCOUNTER — Encounter: Payer: Self-pay | Admitting: Internal Medicine

## 2019-07-19 ENCOUNTER — Other Ambulatory Visit: Payer: Self-pay

## 2019-07-19 DIAGNOSIS — L298 Other pruritus: Secondary | ICD-10-CM

## 2019-07-19 MED ORDER — VALACYCLOVIR HCL 1 G PO TABS: 1000.0000 mg | ORAL_TABLET | Freq: Three times a day (TID) | ORAL | 1 refills | Status: DC

## 2019-07-19 MED ORDER — FLUOCINONIDE 0.05 % EX OINT: 1.0000 "application " | TOPICAL_OINTMENT | Freq: Two times a day (BID) | CUTANEOUS | 1 refills | Status: DC

## 2019-07-19 NOTE — Telephone Encounter (Signed)
Pt has been scheduled already for virtual

## 2019-07-19 NOTE — Progress Notes (Signed)
Virtual Visit via Video Note  I connected with@ on 07/19/19 at  2:00 PM EST by a video enabled telemedicine application and verified that I am speaking with the correct person using two identifiers. Location patient: home Location provider:work  office Persons participating in the virtual visit: patient, provider  [place] national recommendations  regarding COVID 19 pandemic   video visit is advised over in office visit for this patient.  Patient aware  of the limitations of evaluation and management by telemedicine and  availability of in person appointments. and agreed to proceed.   HPI: Toni Bradley presents for video visit    She had e visit for rash right eyelid and chest   Provider was concerned if could be  Herpetic vs contact   Had 2 still photos   She has had itchy and pain ful rash  In some capacity since November?  But now has chest and leg abd and back  Patches  Almost like bug bites but  Some look like tiny blisters   Saw derm and bx showed contact dermatitis and  Patch test advised   She is most uncomfortable  Using powder and gold bond oatmeal some .   No fever systemic sx  She has had 2 courses of prednisone which helped but then came back .  Stopped all her supplements  . Had some  prev topicals that didn't work and expensive  ? clobet  Or halog but expensive and  Not helpful?    protopic not covered but rxed  So not filled.  No one else with rash no fever  Concern about autoimmune when had insurance  Lab had somewhat low   WBC  Asks if important .   ROS: See pertinent positives and negatives per HPI.  Past Medical History:  Diagnosis Date  . Dry skin   . Mood disorder ([place])   . Nasal polyps   . Problems related to lack of adequate sleep   . Recurrent HSV (herpes simplex virus)     Past Surgical History:  Procedure Laterality Date  . childbirth  1998   vaginal  . NASAL POLYP SURGERY     . ovarian cyst sx  1996    Family History  Problem Relation Age of  Onset  . Diabetes Other   . Thyroid disease Other        Sister  . Other Father        Parathyroid  . Breast cancer Sister     Social History   Tobacco Use  . Smoking status: Never Smoker  . Smokeless tobacco: Never Used  Substance Use Topics  . Alcohol use: Yes    Comment: occ.  . Drug use: No      Current Outpatient Medications:  .  ARMOUR THYROID 15 MG tablet, 60 mg. , Disp: , Rfl: 6 .  fluocinonide ointment (LIDEX) [id number] %, Apply 1 application topically 2 (two) times daily. To contact dermatitis ( not on face), Disp: 30 g, Rfl: 1 .  ipratropium (ATROVENT) 0.06 % nasal spray, Place 2 sprays each nostril 3-4 x per day, Disp: 15 mL, Rfl: 12 .  methylphenidate (CONCERTA) 36 MG PO CR tablet, Take 1 tablet (36 mg total) by mouth every morning., Disp: 30 tablet, Rfl: 0 .  progesterone (PROMETRIUM) 100 MG capsule, TAKE 2 CAPSULES BY MOUTH EVERY NIGHT, Disp: , Rfl: 1 .  valACYclovir (VALTREX) 1000 MG tablet, Take 1 tablet (1,000 mg total) by mouth 3 (three) times  daily., Disp: 21 tablet, Rfl: 1 .  valACYclovir (VALTREX) 500 MG tablet, TAKE 1 TABLET BY MOUTH TWICE A DAY, Disp: 60 tablet, Rfl: 0  EXAM: BP Readings from Last 3 Encounters:  05/20/19 110/62  09/21/18 92/60  07/26/17 90/60    VITALS per patient if applicable:  GENERAL: alert, oriented, appears well and in no acute distress  HEENT: atraumatic, conjunttiva clear, no obvious abnormalities on inspection of external nose and ears NECK: normal movements of the head and neck LUNGS: on inspection no signs of respiratory distress, breathing rate appears normal, no obvious gross SOB, gasping or wheezing CV: no obvious cyanosis Can pictures reviewed she has a patch on her left neck that is got white powder on it another area shows a pink-salmon colored patch with may be tiny bumps but no large blisters.  She has no edema noted in her normal speech. MS: moves all visible extremities without noticeable  abnormality  PSYCH/NEURO: pleasant and cooperative, no obvious depression or anxiety, speech and thought processing grossly intact Lab Results  Component Value Date   WBC 7.3 05/20/2019   HGB 14.3 05/20/2019   HCT 42.4 05/20/2019   PLT 293.0 05/20/2019   GLUCOSE 96 05/20/2019   CHOL 203 (H) 05/20/2019   TRIG 80.0 05/20/2019   HDL 63.80 05/20/2019   LDLCALC 124 (H) 05/20/2019   ALT 16 05/20/2019   AST 22 05/20/2019   NA 140 05/20/2019   K 4.1 05/20/2019   CL 100 05/20/2019   CREATININE 0.96 05/20/2019   BUN 14 05/20/2019   CO2 30 05/20/2019   TSH 1.08 05/20/2019    ASSESSMENT AND PLAN:  Discussed the following assessment and plan:    ICD-10-CM   1. Pruritic erythematous rash  L29.8    recurrent  see text  bx CD? or allergic   This problem has been ongoing but  recently was worse and doubt shingles but   Does have hx of  hsv and since concern from prev disc  Will send in valtrex to cover any HSV flares shingles possibility since this is a low risk trip medicine and topical  Steroid s      Do not think impetigo so no antibiotic and she has a call into derm  Agree she should proceed to testing and consider seeing allergies also .  She has been treated twice with systemic steroids by her dermatologist for this problem but apparently is not resolving it getting worse.  I agree that she should proceed with further evaluation allergic testing and referral to tertiary center if needed. dont think the lower wbc is  Clinically sig to this discussion but if needed can review fu.  Counseled.  Patient herself states that she tends to be anxious and worried about things we discussed what we could I do not think this is an autoimmune process otherwise since she had a biopsy reported as contact dermatitis at least as reported by patient but do not have the biopsy to review. She denies the usual contactants.  Expectant management and discussion of plan and treatment with opportunity to ask  questions and all were answered. The patient agreed with the plan and demonstrated an understanding of the instructions.   Advised to call back or seek an in-person evaluation if worsening  or having  further concerns . Outside external source  DATA REVIEWED: Previous visits in chart ED visit picture discussion.  Independent historian:  Total time on date  of service including record review ordering  Counseling  Medications and plan of care:  30 minutes     Return for to fu with her dermatologist   contact plan.    Shanon Ace, MD

## 2019-08-08 DIAGNOSIS — E039 Hypothyroidism, unspecified: Secondary | ICD-10-CM | POA: Diagnosis not present

## 2019-08-13 DIAGNOSIS — E039 Hypothyroidism, unspecified: Secondary | ICD-10-CM | POA: Diagnosis not present

## 2019-08-13 DIAGNOSIS — E063 Autoimmune thyroiditis: Secondary | ICD-10-CM | POA: Diagnosis not present

## 2019-08-13 DIAGNOSIS — R635 Abnormal weight gain: Secondary | ICD-10-CM | POA: Diagnosis not present

## 2019-08-15 DIAGNOSIS — R635 Abnormal weight gain: Secondary | ICD-10-CM | POA: Diagnosis not present

## 2019-08-26 NOTE — Telephone Encounter (Signed)
Spoke with pt and offered an appt at 3 pt states that rash is better now and appt no longer needed

## 2019-09-02 ENCOUNTER — Encounter: Payer: Self-pay | Admitting: Internal Medicine

## 2019-09-02 ENCOUNTER — Other Ambulatory Visit: Payer: Self-pay

## 2019-09-02 ENCOUNTER — Telehealth (INDEPENDENT_AMBULATORY_CARE_PROVIDER_SITE_OTHER): Payer: Self-pay | Admitting: Internal Medicine

## 2019-09-02 DIAGNOSIS — L509 Urticaria, unspecified: Secondary | ICD-10-CM

## 2019-09-02 DIAGNOSIS — E038 Other specified hypothyroidism: Secondary | ICD-10-CM

## 2019-09-02 DIAGNOSIS — E063 Autoimmune thyroiditis: Secondary | ICD-10-CM

## 2019-09-02 DIAGNOSIS — Z872 Personal history of diseases of the skin and subcutaneous tissue: Secondary | ICD-10-CM

## 2019-09-02 NOTE — Telephone Encounter (Signed)
See video visit

## 2019-09-02 NOTE — Telephone Encounter (Signed)
Pt has been made appt  

## 2019-09-02 NOTE — Progress Notes (Signed)
Virtual Visit via Video Note  I connected with@ on 09/02/19 at  9:00 AM EST by a video enabled telemedicine application and verified that I am speaking with the correct person using two identifiers. Location patient: home Location provider:work  office Persons participating in the virtual visit: patient, provider  [place] national recommendations  regarding COVID 19 pandemic   video visit is advised over in office visit for this patient.  Patient aware  of the limitations of evaluation and management by telemedicine and  availability of in person appointments. and agreed to proceed.   HPI: Toni Bradley presents for video visit sda had the onset of itchy stinging rash on the right forehead and itching into the scalp and around the right eye right before the weekend woke up with it coming and going today it is down.  Described as hives is switched Allegra to Claritin.  No new topicals.  However she has been having rashes for a few months see previous notes.  The rash under her breast was biopsied by dermatologist and diagnosed as contact dermatitis she had 2 rounds of systemic steroids prednisone the last 1 in December.  This helped it fade away. No new topical make up she did go to the nail salon recently and tends to get clogged nose for a few days after she goes but no new contactants.  Wonders if she could have an autoimmune problem causing this. No fever or systemic symptoms otherwise. Medications include Vyvanse Armour Thyroid vitamin D implanted testosterone pellets and progesterone Prometrium that she has been on before. No specific flare when she eats red meat but has not for the last 3 weeks until last night.    ROS: See pertinent positives and negatives per HPI.  No respiratory other symptoms.  Past Medical History:  Diagnosis Date  . Dry skin   . Mood disorder ([place])   . Nasal polyps   . Problems related to lack of adequate sleep   . Recurrent HSV (herpes simplex virus)      Past Surgical History:  Procedure Laterality Date  . childbirth  1998   vaginal  . NASAL POLYP SURGERY     . ovarian cyst sx  1996    Family History  Problem Relation Age of Onset  . Diabetes Other   . Thyroid disease Other        Sister  . Other Father        Parathyroid  . Breast cancer Sister     Social History   Tobacco Use  . Smoking status: Never Smoker  . Smokeless tobacco: Never Used  Substance Use Topics  . Alcohol use: Yes    Comment: occ.  . Drug use: No       EXAM: BP Readings from Last 3 Encounters:  05/20/19 110/62  09/21/18 92/60  07/26/17 90/60    VITALS per patient if applicable: See picture on my chart.  Hives blotchy type rash on the right forehead temple around the eye but no angioedema.  GENERAL: alert, oriented, appears well and in no acute distress  HEENT: atraumatic, conjunttiva clear, no obvious abnormalities on inspection of external nose and ears  NECK: normal movements of the head and neck  LUNGS: on inspection no signs of respiratory distress, breathing rate appears normal, no obvious gross SOB, gasping or wheezing  CV: no obvious cyanosis  MS: moves all visible extremities without noticeable abnormality  PSYCH/NEURO: pleasant and cooperative, no obvious depression or anxiety, speech and thought  processing grossly intact Lab Results  Component Value Date   WBC 7.3 05/20/2019   HGB 14.3 05/20/2019   HCT 42.4 05/20/2019   PLT 293.0 05/20/2019   GLUCOSE 96 05/20/2019   CHOL 203 (H) 05/20/2019   TRIG 80.0 05/20/2019   HDL 63.80 05/20/2019   LDLCALC 124 (H) 05/20/2019   ALT 16 05/20/2019   AST 22 05/20/2019   NA 140 05/20/2019   K 4.1 05/20/2019   CL 100 05/20/2019   CREATININE 0.96 05/20/2019   BUN 14 05/20/2019   CO2 30 05/20/2019   TSH 1.08 05/20/2019   Med list includes vit d vyvanse   progesterone  Hormone implants and  Armour thyroid  But no changes in meds  ASSESSMENT AND PLAN:  Discussed the following  assessment and plan:    ICD-10-CM   1. Hives of unknown origin  L50.9 Ambulatory referral to Allergy  2. H/O contact dermatitis  Z87.2 Ambulatory referral to Allergy   inframammary and truncal rash other rashes   3. Hypothyroidism due to Hashimoto's thyroiditis  E03.8    E06.3    Recurrent rashes unclear if they are related previous one was contact but may have started out as hives or other trigger treated by dermatologist with prednisone These new symptoms on her face appear to be hives uncertain cause.  Discussed avoiding contact him to new medicines etc. take a daily antihistamine such as Xyzal Zyrtec or Claritin and add Benadryl 25 mg at night for suppression at this time Referral to allergy for help  delineating   allergic or reactive  Sx    Expectant management and discussion of plan and treatment with opportunity to ask questions and all were answered. The patient agreed with the plan and demonstrated an understanding of the instructions. Reviewed data   Advised to call back or seek an in-person evaluation if worsening  or having  further concerns . Return if symptoms worsen in interim.   Shanon Ace, MD

## 2019-09-04 DIAGNOSIS — E349 Endocrine disorder, unspecified: Secondary | ICD-10-CM | POA: Diagnosis not present

## 2019-09-04 DIAGNOSIS — M2550 Pain in unspecified joint: Secondary | ICD-10-CM | POA: Diagnosis not present

## 2019-09-04 DIAGNOSIS — F900 Attention-deficit hyperactivity disorder, predominantly inattentive type: Secondary | ICD-10-CM | POA: Diagnosis not present

## 2019-09-04 DIAGNOSIS — R5383 Other fatigue: Secondary | ICD-10-CM | POA: Diagnosis not present

## 2019-09-04 DIAGNOSIS — N951 Menopausal and female climacteric states: Secondary | ICD-10-CM | POA: Diagnosis not present

## 2019-09-05 ENCOUNTER — Other Ambulatory Visit: Payer: Self-pay

## 2019-09-05 ENCOUNTER — Encounter: Payer: Self-pay | Admitting: Allergy

## 2019-09-05 ENCOUNTER — Ambulatory Visit: Payer: BC Managed Care – PPO | Admitting: Allergy

## 2019-09-05 VITALS — BP 112/68 | HR 80 | Temp 97.3°F | Resp 16 | Ht 63.78 in | Wt 135.5 lb

## 2019-09-05 DIAGNOSIS — L259 Unspecified contact dermatitis, unspecified cause: Secondary | ICD-10-CM

## 2019-09-05 MED ORDER — HYDROXYZINE HCL 25 MG PO TABS: 25.0000 mg | ORAL_TABLET | Freq: Every evening | ORAL | 2 refills | Status: DC | PRN

## 2019-09-05 MED ORDER — DESONIDE 0.05 % EX OINT: 1.0000 "application " | TOPICAL_OINTMENT | Freq: Two times a day (BID) | CUTANEOUS | 2 refills | Status: AC | PRN

## 2019-09-05 NOTE — Patient Instructions (Addendum)
The distribution of your facial rash is concerning for contact dermatitis.   See below for proper skin are.  May use desonide ointment twice a day as needed until the rash clears up. Be careful to not get the ointment into the eyeball.  Use zyrtec, allegra or claritin 1 tablet in the morning to help with the itching.  Use hydroxyzine 25mg  1 hour before bedtime as needed for the itching.  Recommend that you scheduled for patch testing either via your dermatologist's office OR our office.  You would have to come to our [place] office location.   Follow up in 4 weeks or sooner if needed.    Patches are best placed on Monday with return to office on Wednesday and Friday of same week for readings.  Patches once placed should not get wet.  You do not have to stop any medications for patch testing but should not be on oral prednisone. You can schedule a patch testing visit when convenient for your schedule.    True Test looks for the following sensitivities:      Skin care recommendations  Bath time: . Always use lukewarm water. AVOID very hot or cold water. [name] Keep bathing time to 5-10 minutes. . Do NOT use bubble bath. . Use a mild soap and use just enough to wash the dirty areas. . Do NOT scrub skin vigorously.  . After bathing, pat dry your skin with a towel. Do NOT rub or scrub the skin.  Moisturizers and prescriptions:  . ALWAYS apply moisturizers immediately after bathing (within 3 minutes). This helps to lock-in moisture. . Use the moisturizer several times a day over the whole body. Kermit Balo summer moisturizers include: Aveeno, CeraVe, Cetaphil. Kermit Balo winter moisturizers include: Aquaphor, Vaseline, Cerave, Cetaphil, Eucerin, Vanicream. . When using moisturizers along with medications, the moisturizer should be applied about one hour after applying the medication to prevent diluting effect of the medication or moisturize around where you applied the medications. When not  using medications, the moisturizer can be continued twice daily as maintenance.  Laundry and clothing: . Avoid laundry products with added color or perfumes. . Use unscented hypo-allergenic laundry products such as Tide free, Cheer free & gentle, and All free and clear.  . If the skin still seems dry or sensitive, you can try double-rinsing the clothes. . Avoid tight or scratchy clothing such as wool. . Do not use fabric softeners or dyer sheets.

## 2019-09-05 NOTE — Progress Notes (Signed)
New Patient Note  RE: Toni Bradley MRN: [id number] DOB: December 08, 1970 Date of Office Visit: 09/05/2019  Referring provider: Burnis Medin, MD Primary care provider: Burnis Medin, MD  Chief Complaint: Urticaria  History of Present Illness: I had the pleasure of seeing Toni Bradley for initial evaluation at the [facility] on 09/05/2019. She is a 49 y.o. female, who is referred here by [name], [name], MD for the evaluation of rash.  Rash started about in October 2020. This initially started with a small/itchy rash in the corner of her right eye, which then progressed to her chest area. Tried benadryl cream with some benefit.  She was evaluated by dermatology and initially treated with prednisone in November and there ash completely resolved but then returned in December. She had another course of prednisone which helped but then the rash returned on her areola region.   She underwent skin biopsy which showed contact dermatitis. She had significant pain around her breast area and started to break out in little bumps on her abdominal area.   PCP started her on Shingles medication for this which helped with the areola and abdominal rash.   She is still having issues with the rash near her right eye. This has not improved. She also noted itchy scalp and rash around her hairline. She had some rashes on her back as well. This started in February. She did have her hair dyed in January.  Currently using goldbond and ketoconazole shampoo with no benefit.    No ecchymosis upon resolution. Associated symptoms include: none. Suspected triggers are unknown. Denies any fevers, chills, changes in medications, foods, personal care products or recent infections. She has tried the following therapies: topical clobetasol with some benefit. Systemic steroids yes x 2. Tried Claritin, benadryl, zyrtec with minimal benefit.   Currently using dove, olay, all natural vegan cleanser, and  wearing limited make up.  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 Patient has a vasomotor rhinitis reaction when she is at a nail salon. She stopped going to the nail salon for a few weeks but when she didn't notice any improvement she started going back again.   Previous history of rash/hives: no. Patient is up to date with the following cancer screening tests: pap smears, mammogram  Assessment and Plan: [name] is a 49 y.o. female with: Contact dermatitis Persistent pruritic, erythematous rash since October 2020. Completely resolved while on prednisone. No specific triggers noted. Denies changes in medications, diet or personal care products. Seen by dermatology and biopsy showed contact dermatitis. Currently has rash on hairline, near the right eye and complaining of pruritic scalp. Symptoms flared 2 weeks after getting hair dyed.  Discussed with patient that based on her clinical history and distribution of her rash, the rash seems to be consistent with contact dermatitis.   Discussed proper skin are - using fragrance free and dye free products.   May use desonide 0.05% ointment twice a day as needed until the rash clears up on the face. Be careful to not get the ointment into the eyeball.  Use zyrtec, allegra or Claritin 1 tablet in the morning to help with the itching.  Use hydroxyzine 25mg  1 hour before bedtime as needed for the itching.  Recommend patch testing to identify the culprit chemical triggering her symptoms.   There is no indication for any environmental or food allergy testing.  Return in about 4 weeks (  around 10/03/2019).  Meds ordered this encounter  Medications  . desonide (DESOWEN) 0.05 % ointment    Sig: Apply 1 application topically 2 (two) times daily as needed. On the rashes. Stop when clear.    Dispense:  15 g    Refill:  2  . hydrOXYzine (ATARAX/VISTARIL) 25 MG tablet    Sig: Take 1 tablet (25 mg total)  by mouth at bedtime as needed for itching.    Dispense:  30 tablet    Refill:  2   Other allergy screening: Asthma: no Rhino conjunctivitis: no Food allergy: no Medication allergy: yes  Nitrofurantoin - hives Hymenoptera allergy: no Eczema:no History of recurrent infections suggestive of immunodeficency: no  Diagnostics: None.  Past Medical History: Patient Active Problem List   Diagnosis Date Noted  . Contact dermatitis 09/05/2019  . Hypothyroidism due to Hashimoto's thyroiditis 01/18/2016  . Other malaise and fatigue 04/05/2014  . Weight gain concern 08/28/2013  . Irregular menstrual cycle poss perimenopausal 08/28/2013  . Visit for preventive health examination 06/01/2011  . Recurrent HSV (herpes simplex virus) 06/01/2011  . Recurrent HSV (herpes simplex virus)   . LEUKOPENIA, MILD 03/10/2009  . XERODERMA 03/10/2009  . NONSPECIFIC ABNORM RESULTS THYROID FUNCT STUDY 09/17/2007  . URTICARIA 03/02/2007  . LOW BACK PAIN, MILD 03/02/2007   Past Medical History:  Diagnosis Date  . Dry skin   . Mood disorder ([place])   . Nasal polyps   . Problems related to lack of adequate sleep   . Recurrent HSV (herpes simplex virus)    Past Surgical History: Past Surgical History:  Procedure Laterality Date  . childbirth  1998   vaginal  . NASAL POLYP SURGERY     . ovarian cyst sx  1996   Medication List:  Current Outpatient Medications  Medication Sig Dispense Refill  . ARMOUR THYROID 15 MG tablet 75 mg.   6  . clobetasol cream (TEMOVATE) 0.05 % APPLY AS DIRECTED TO SKIN TWICE A DAY AS NEEDED FOR VERY ITCHY AREAS    . fluticasone (FLONASE) 50 MCG/ACT nasal spray fluticasone propionate 50 mcg/actuation nasal spray,suspension  USE 1 SPRAY IN EACH NOSTRIL TWICE A DAY    . progesterone (PROMETRIUM) 100 MG capsule TAKE 2 CAPSULES BY MOUTH EVERY NIGHT  1  . VYVANSE 30 MG capsule Take 30 mg by mouth every morning.    [name] desonide (DESOWEN) 0.05 % ointment Apply 1 application topically  2 (two) times daily as needed. On the rashes. Stop when clear. 15 g 2  . hydrOXYzine (ATARAX/VISTARIL) 25 MG tablet Take 1 tablet (25 mg total) by mouth at bedtime as needed for itching. 30 tablet 2   No current facility-administered medications for this visit.   Allergies: Allergies  Allergen Reactions  . Nitrofurantoin     REACTION: hives 3-4 days off medication   Social History: Social History   Socioeconomic History  . Marital status: Married    Spouse name: Not on file  . Number of children: Not on file  . Years of education: Not on file  . Highest education level: Not on file  Occupational History  . Not on file  Tobacco Use  . Smoking status: Never Smoker  . Smokeless tobacco: Never Used  Substance and Sexual Activity  . Alcohol use: Yes    Comment: occ.  . Drug use: No  . Sexual activity: Yes  Other Topics Concern  . Not on file  Social History Narrative   Married with children  Going to school.[name]...job real estate   hh OF 4  [place].    No ets.    Work out 5 x per week.    6-7 hours             Social Determinants of Health   Financial Resource Strain:   . Difficulty of Paying Living Expenses: Not on file  Food Insecurity:   . Worried About Charity fundraiser in the Last Year: Not on file  . Ran Out of Food in the Last Year: Not on file  Transportation Needs:   . Lack of Transportation (Medical): Not on file  . Lack of Transportation (Non-Medical): Not on file  Physical Activity:   . Days of Exercise per Week: Not on file  . Minutes of Exercise per Session: Not on file  Stress:   . Feeling of Stress : Not on file  Social Connections:   . Frequency of Communication with Friends and Family: Not on file  . Frequency of Social Gatherings with Friends and Family: Not on file  . Attends Religious Services: Not on file  . Active Member of Clubs or Organizations: Not on file  . Attends Archivist Meetings: Not on file  .  Marital Status: Not on file   Lives in a 48 year old home. Smoking: denies Occupation: Paramedic HistoryFreight forwarder in the house: no Charity fundraiser in the family room: yes Carpet in the bedroom: yes Heating: gas Cooling: central Pet: yes 2 dogs x 10 yrs, 3 yrs  Family History: Family History  Problem Relation Age of Onset  . Diabetes Other   . Thyroid disease Other        Sister  . Other Father        Parathyroid  . Breast cancer Sister    Problem                               Relation Asthma                                   No  Eczema                                No  Food allergy                          No  Allergic rhino conjunctivitis     No   Review of Systems  Constitutional: Negative for appetite change, chills, fever and unexpected weight change.  HENT: Negative for congestion, rhinorrhea and sneezing.   Eyes: Negative for itching.  Respiratory: Negative for cough, chest tightness, shortness of breath and wheezing.   Cardiovascular: Negative for chest pain.  Gastrointestinal: Negative for abdominal pain.  Genitourinary: Negative for difficulty urinating.  Skin: Positive for rash.  Allergic/Immunologic: Negative for environmental allergies and food allergies.  Neurological: Negative for headaches.   Objective: BP 112/68 (BP Location: Left Arm, Patient Position: Sitting, Cuff Size: Normal)   Pulse 80   Temp (!) 97.3 F (36.3 C) (Temporal)   Resp 16   Ht 5' 3.78" (1.62 m)   Wt 135 lb 8 oz (61.5 kg)   SpO2 100%   BMI 23.42 kg/m  Body mass index is 23.42 kg/m. Physical Exam  Constitutional: She is oriented to person, place, and time. She appears well-developed and well-nourished.  HENT:  Head: Normocephalic and atraumatic.  Right Ear: External ear normal.  Left Ear: External ear normal.  Nose: Nose normal.  Mouth/Throat: Oropharynx is clear and moist.  Eyes: Conjunctivae and EOM are normal.  Cardiovascular: Normal  rate, regular rhythm and normal heart sounds. Exam reveals no gallop and no friction rub.  No murmur heard. Pulmonary/Chest: Effort normal and breath sounds normal. She has no wheezes. She has no rales.  Abdominal: Soft.  Musculoskeletal:     Cervical back: Neck supple.  Neurological: She is alert and oriented to person, place, and time.  Skin: Skin is warm and dry. Rash noted.  Erythematous dry circular patch on b/l temporal region and right inner canthus area.   Psychiatric: She has a normal mood and affect. Her behavior is normal.  Nursing note and vitals reviewed.  The plan was reviewed with the patient/family, and all questions/concerned were addressed.  It was my pleasure to see Malanna today and participate in her care. Please feel free to contact me with any questions or concerns.  Sincerely,  Rexene Alberts, DO Allergy & Immunology  [facility] office: [phone] [facility] office: [place] office: [phone]

## 2019-09-05 NOTE — Assessment & Plan Note (Addendum)
Persistent pruritic, erythematous rash since October 2020. Completely resolved while on prednisone. No specific triggers noted. Denies changes in medications, diet or personal care products. Seen by dermatology and biopsy showed contact dermatitis. Currently has rash on hairline, near the right eye and complaining of pruritic scalp. Symptoms flared 2 weeks after getting hair dyed.  Discussed with patient that based on her clinical history and distribution of her rash, the rash seems to be consistent with contact dermatitis.   Discussed proper skin are - using fragrance free and dye free products.   May use desonide 0.05% ointment twice a day as needed until the rash clears up on the face. Be careful to not get the ointment into the eyeball.  Use zyrtec, allegra or Claritin 1 tablet in the morning to help with the itching.  Use hydroxyzine 25mg  1 hour before bedtime as needed for the itching.  Recommend patch testing to identify the culprit chemical triggering her symptoms.   There is no indication for any environmental or food allergy testing.

## 2019-09-20 DIAGNOSIS — E063 Autoimmune thyroiditis: Secondary | ICD-10-CM | POA: Diagnosis not present

## 2019-09-20 DIAGNOSIS — R635 Abnormal weight gain: Secondary | ICD-10-CM | POA: Diagnosis not present

## 2019-09-20 DIAGNOSIS — E039 Hypothyroidism, unspecified: Secondary | ICD-10-CM | POA: Diagnosis not present

## 2019-09-24 DIAGNOSIS — E039 Hypothyroidism, unspecified: Secondary | ICD-10-CM | POA: Diagnosis not present

## 2019-09-24 DIAGNOSIS — R635 Abnormal weight gain: Secondary | ICD-10-CM | POA: Diagnosis not present

## 2019-09-24 DIAGNOSIS — E063 Autoimmune thyroiditis: Secondary | ICD-10-CM | POA: Diagnosis not present

## 2019-10-21 DIAGNOSIS — Z1231 Encounter for screening mammogram for malignant neoplasm of breast: Secondary | ICD-10-CM | POA: Diagnosis not present

## 2019-10-21 DIAGNOSIS — Z01419 Encounter for gynecological examination (general) (routine) without abnormal findings: Secondary | ICD-10-CM | POA: Diagnosis not present

## 2019-10-21 DIAGNOSIS — Z6823 Body mass index (BMI) 23.0-23.9, adult: Secondary | ICD-10-CM | POA: Diagnosis not present

## 2019-11-04 DIAGNOSIS — F900 Attention-deficit hyperactivity disorder, predominantly inattentive type: Secondary | ICD-10-CM | POA: Diagnosis not present

## 2019-12-05 DIAGNOSIS — L218 Other seborrheic dermatitis: Secondary | ICD-10-CM | POA: Diagnosis not present

## 2019-12-05 DIAGNOSIS — L503 Dermatographic urticaria: Secondary | ICD-10-CM | POA: Diagnosis not present

## 2020-01-02 DIAGNOSIS — L218 Other seborrheic dermatitis: Secondary | ICD-10-CM | POA: Diagnosis not present

## 2020-01-02 DIAGNOSIS — L308 Other specified dermatitis: Secondary | ICD-10-CM | POA: Diagnosis not present

## 2020-03-30 DIAGNOSIS — E039 Hypothyroidism, unspecified: Secondary | ICD-10-CM | POA: Diagnosis not present

## 2020-04-13 DIAGNOSIS — L218 Other seborrheic dermatitis: Secondary | ICD-10-CM | POA: Diagnosis not present

## 2020-06-17 DIAGNOSIS — F900 Attention-deficit hyperactivity disorder, predominantly inattentive type: Secondary | ICD-10-CM | POA: Diagnosis not present

## 2020-06-30 ENCOUNTER — Telehealth: Payer: Self-pay

## 2020-06-30 DIAGNOSIS — Z1211 Encounter for screening for malignant neoplasm of colon: Secondary | ICD-10-CM

## 2020-06-30 NOTE — Telephone Encounter (Signed)
GI referral order placed  ?

## 2020-06-30 NOTE — Telephone Encounter (Signed)
Please place order for colonoscopy referral  I do not see which cream she is speaking of for refill Please contact patient for more information. Okay to refill x1.

## 2020-06-30 NOTE — Telephone Encounter (Signed)
Patient called, left VM to return the call to the office to let us know the cream she is wanting a refill on.

## 2020-07-10 ENCOUNTER — Encounter: Payer: Self-pay | Admitting: Internal Medicine

## 2020-07-28 MED ORDER — HYDROCORTISONE (PERIANAL) 2.5 % EX CREA: 1.0000 "application " | TOPICAL_CREAM | Freq: Two times a day (BID) | CUTANEOUS | 0 refills | Status: DC

## 2020-07-28 NOTE — Addendum Note (Signed)
Addended byShanon Ace K on: 07/28/2020 10:49 AM   Modules accepted: Orders

## 2020-07-28 NOTE — Telephone Encounter (Signed)
Sent in electronically . X 1 anusol hc2.5

## 2020-09-01 ENCOUNTER — Other Ambulatory Visit: Payer: Self-pay

## 2020-09-01 ENCOUNTER — Ambulatory Visit (AMBULATORY_SURGERY_CENTER): Payer: Self-pay | Admitting: *Deleted

## 2020-09-01 VITALS — Ht 63.0 in | Wt 127.8 lb

## 2020-09-01 DIAGNOSIS — L2084 Intrinsic (allergic) eczema: Secondary | ICD-10-CM | POA: Diagnosis not present

## 2020-09-01 DIAGNOSIS — Z1211 Encounter for screening for malignant neoplasm of colon: Secondary | ICD-10-CM

## 2020-09-01 DIAGNOSIS — L219 Seborrheic dermatitis, unspecified: Secondary | ICD-10-CM | POA: Diagnosis not present

## 2020-09-01 NOTE — Progress Notes (Signed)
No egg or soy allergy known to patient  issues with past sedation with any surgeries or procedures of PONV and Itching  No intubation problems in the past  No FH of Malignant Hyperthermia No diet pills per patient No home 02 use per patient  No blood thinners per patient  Pt denies issues with constipation  No A fib or A flutter  EMMI video to pt or via [place] 19 guidelines implemented in PV today with Pt and RN    Due to the COVID-19 pandemic we are asking patients to follow certain guidelines.  Pt aware of COVID protocols and [facility] guidelines

## 2020-09-04 ENCOUNTER — Encounter: Payer: Self-pay | Admitting: Internal Medicine

## 2020-09-15 ENCOUNTER — Ambulatory Visit (AMBULATORY_SURGERY_CENTER): Payer: BC Managed Care – PPO | Admitting: Internal Medicine

## 2020-09-15 ENCOUNTER — Encounter: Payer: Self-pay | Admitting: Internal Medicine

## 2020-09-15 ENCOUNTER — Other Ambulatory Visit: Payer: Self-pay

## 2020-09-15 VITALS — BP 101/62 | HR 76 | Temp 97.1°F | Resp 14 | Ht 63.78 in | Wt 127.8 lb

## 2020-09-15 DIAGNOSIS — Z1211 Encounter for screening for malignant neoplasm of colon: Secondary | ICD-10-CM

## 2020-09-15 DIAGNOSIS — K635 Polyp of colon: Secondary | ICD-10-CM

## 2020-09-15 DIAGNOSIS — D120 Benign neoplasm of cecum: Secondary | ICD-10-CM

## 2020-09-15 MED ORDER — SODIUM CHLORIDE 0.9 % IV SOLN: 500.0000 mL | Freq: Once | INTRAVENOUS | Status: DC

## 2020-09-15 NOTE — Patient Instructions (Addendum)
I found and removed 1 tiny flat polyp.  We will see what it was and what that means as far as repeating a colonoscopy.  I think it would be at least 7 years from now.  I will let you know pathology results and when to have another routine colonoscopy by mail and/or My Chart.  I did not see an anal tag though the skin is a little bit thickened in the anal area and that might be what is irritating you.  There was one tiny protrusion but not a classic tag.  If it bothers you with wiping there is a product called Qleanse which you can purchase and similar ones that she can spray on the toilet paper to turn them into wet wipes.  This may alleviate symptoms from wiping.  You can google it and order it online I think [employer] has similar things.  I just learned about this and it looks like something that can be helpful.  I appreciate the opportunity to care for you. Gatha Mayer, MD, [facility]  Handout given for polyps.  YOU HAD AN ENDOSCOPIC PROCEDURE TODAY AT [facility]:   Refer to the procedure report that was given to you for any specific questions about what was found during the examination.  If the procedure report does not answer your questions, please call your gastroenterologist to clarify.  If you requested that your care partner not be given the details of your procedure findings, then the procedure report has been included in a sealed envelope for you to review at your convenience later.  YOU SHOULD EXPECT: Some feelings of bloating in the abdomen. Passage of more gas than usual.  Walking can help get rid of the air that was put into your GI tract during the procedure and reduce the bloating. If you had a lower endoscopy (such as a colonoscopy or flexible sigmoidoscopy) you may notice spotting of blood in your stool or on the toilet paper. If you underwent a bowel prep for your procedure, you may not have a normal bowel movement for a few days.  Please Note:  You might notice some  irritation and congestion in your nose or some drainage.  This is from the oxygen used during your procedure.  There is no need for concern and it should clear up in a day or so.  SYMPTOMS TO REPORT IMMEDIATELY:   Following lower endoscopy (colonoscopy or flexible sigmoidoscopy):  Excessive amounts of blood in the stool  Significant tenderness or worsening of abdominal pains  Swelling of the abdomen that is new, acute  Fever of 100F or higher  For urgent or emergent issues, a gastroenterologist can be reached at any hour by calling [phone]. Do not use MyChart messaging for urgent concerns.    DIET:  We do recommend a small meal at first, but then you may proceed to your regular diet.  Drink plenty of fluids but you should avoid alcoholic beverages for 24 hours.  ACTIVITY:  You should plan to take it easy for the rest of today and you should NOT DRIVE or use heavy machinery until tomorrow (because of the sedation medicines used during the test).    FOLLOW UP: Our staff will call the number listed on your records 48-72 hours following your procedure to check on you and address any questions or concerns that you may have regarding the information given to you following your procedure. If we do not reach you, we will leave  a message.  We will attempt to reach you two times.  During this call, we will ask if you have developed any symptoms of COVID 19. If you develop any symptoms (ie: fever, flu-like symptoms, shortness of breath, cough etc.) before then, please call [phone].  If you test positive for Covid 19 in the 2 weeks post procedure, please call and report this information to [place].    If any biopsies were taken you will be contacted by phone or by letter within the next 1-3 weeks.  Please call us at [phone] if you have not heard about the biopsies in 3 weeks.    SIGNATURES/CONFIDENTIALITY: You and/or your care partner have signed paperwork which will be entered into  your electronic medical record.  These signatures attest to the fact that that the information above on your After Visit Summary has been reviewed and is understood.  Full responsibility of the confidentiality of this discharge information lies with you and/or your care-partner.

## 2020-09-15 NOTE — Op Note (Signed)
[facility] Patient Name: Toni Bradley Procedure Date: 09/15/2020 7:37 AM MRN: [id number] Endoscopist: Gatha Mayer , MD Age: 50 Referring MD:  Date of Birth: 08-Apr-1971 Gender: Female Account #: [id number] Procedure:                Colonoscopy Indications:              Screening for colorectal malignant neoplasm, This                            is the patient's first colonoscopy Medicines:                Propofol per Anesthesia, Monitored Anesthesia Care Procedure:                Pre-Anesthesia Assessment:                           - Prior to the procedure, a History and Physical                            was performed, and patient medications and                            allergies were reviewed. The patient's tolerance of                            previous anesthesia was also reviewed. The risks                            and benefits of the procedure and the sedation                            options and risks were discussed with the patient.                            All questions were answered, and informed consent                            was obtained. Prior Anticoagulants: The patient has                            taken no previous anticoagulant or antiplatelet                            agents. ASA Grade Assessment: II - A patient with                            mild systemic disease. After reviewing the risks                            and benefits, the patient was deemed in                            satisfactory condition to undergo the procedure.  After obtaining informed consent, the colonoscope                            was passed under direct vision. Throughout the                            procedure, the patient's blood pressure, pulse, and                            oxygen saturations were monitored continuously. The                            Olympus PCF-H190DL ([id number]) Colonoscope was                             introduced through the anus and advanced to the the                            cecum, identified by appendiceal orifice and                            ileocecal valve. The colonoscopy was performed                            without difficulty. The patient tolerated the                            procedure well. The quality of the bowel                            preparation was good. The bowel preparation used                            was Miralax via split dose instruction. The                            ileocecal valve, appendiceal orifice, and rectum                            were photographed. Scope In: 8:16:23 AM Scope Out: 8:31:32 AM Scope Withdrawal Time: 0 hours 11 minutes 28 seconds  Total Procedure Duration: 0 hours 15 minutes 9 seconds  Findings:                 A 4 mm polyp was found in the cecum. The polyp was                            flat. The polyp was removed with a cold snare.                            Resection and retrieval were complete. Verification                            of patient identification for the specimen was  done. Estimated blood loss was minimal.                           The perianal exam findings include tiny raised                            thickened skin posterior.                           The digital rectal exam was normal.                           The exam was otherwise without abnormality on                            direct and retroflexion views. Complications:            No immediate complications. Estimated Blood Loss:     Estimated blood loss was minimal. Impression:               - One 4 mm polyp in the cecum, removed with a cold                            snare. Resected and retrieved.                           - Tiny raised thickened skin posterior found on                            perianal exam.                           - The examination was otherwise normal on direct                             and retroflexion views. Recommendation:           - Patient has a contact number available for                            emergencies. The signs and symptoms of potential                            delayed complications were discussed with the                            patient. Return to normal activities tomorrow.                            Written discharge instructions were provided to the                            patient.                           - Resume previous diet.                           -  Continue present medications.                           - Repeat colonoscopy is recommended. The                            colonoscopy date will be determined after pathology                            results from today's exam become available for                            review. Gatha Mayer, MD 09/15/2020 8:40:05 AM This report has been signed electronically.

## 2020-09-15 NOTE — Progress Notes (Signed)
Called to room to assist during endoscopic procedure.  Patient ID and intended procedure confirmed with present staff. Received instructions for my participation in the procedure from the performing physician.  

## 2020-09-15 NOTE — Progress Notes (Signed)
Report to PACU, RN, vss, BBS= Clear.  

## 2020-09-15 NOTE — Progress Notes (Signed)
VS by CW  Pt's states no medical or surgical changes since previsit or office visit.  

## 2020-09-17 ENCOUNTER — Telehealth: Payer: Self-pay

## 2020-09-17 NOTE — Telephone Encounter (Signed)
  Follow up Call-  Call back number 09/15/2020  Post procedure Call Back phone  # [phone]  Permission to leave phone message Yes  Some recent data might be hidden     Patient questions:  Do you have a fever, pain , or abdominal swelling? No. Pain Score  0 *  Have you tolerated food without any problems? Yes.    Have you been able to return to your normal activities? Yes.    Do you have any questions about your discharge instructions: Diet   No. Medications  No. Follow up visit  No.  Do you have questions or concerns about your Care? No.  Actions: * If pain score is 4 or above: No action needed, pain <4.  1. Have you developed a fever since your procedure? no  2.   Have you had an respiratory symptoms (SOB or cough) since your procedure? no  3.   Have you tested positive for COVID 19 since your procedure no  4.   Have you had any family members/close contacts diagnosed with the COVID 19 since your procedure?  no   If yes to any of these questions please route to Joylene John, RN and Joella Prince, RN

## 2020-09-17 NOTE — Telephone Encounter (Signed)
entered in error. MAW,RN

## 2020-09-25 ENCOUNTER — Encounter: Payer: Self-pay | Admitting: Internal Medicine

## 2020-09-25 NOTE — Telephone Encounter (Signed)
Called and spoke with the pt and discussed the path results with her.  All questions answered in regards to timing of next colon.

## 2020-10-28 MED ORDER — VALACYCLOVIR HCL 500 MG PO TABS: 500.0000 mg | ORAL_TABLET | Freq: Two times a day (BID) | ORAL | 1 refills | Status: AC

## 2020-10-28 NOTE — Telephone Encounter (Signed)
Ok to refill   Valtrex 500 mg tale 2 po bid as needed for fever blister  Disp 60 .   Arrange for her to have a yearly visit   For any further refills   Last visit was telel march 2021

## 2020-10-29 DIAGNOSIS — E039 Hypothyroidism, unspecified: Secondary | ICD-10-CM | POA: Diagnosis not present

## 2020-10-29 DIAGNOSIS — Z6822 Body mass index (BMI) 22.0-22.9, adult: Secondary | ICD-10-CM | POA: Diagnosis not present

## 2020-10-29 DIAGNOSIS — Z1231 Encounter for screening mammogram for malignant neoplasm of breast: Secondary | ICD-10-CM | POA: Diagnosis not present

## 2020-10-29 DIAGNOSIS — Z01419 Encounter for gynecological examination (general) (routine) without abnormal findings: Secondary | ICD-10-CM | POA: Diagnosis not present

## 2020-10-29 DIAGNOSIS — R7301 Impaired fasting glucose: Secondary | ICD-10-CM | POA: Diagnosis not present

## 2020-10-31 ENCOUNTER — Other Ambulatory Visit: Payer: Self-pay | Admitting: Internal Medicine

## 2020-11-04 DIAGNOSIS — E063 Autoimmune thyroiditis: Secondary | ICD-10-CM | POA: Diagnosis not present

## 2020-11-04 DIAGNOSIS — E039 Hypothyroidism, unspecified: Secondary | ICD-10-CM | POA: Diagnosis not present

## 2020-11-20 DIAGNOSIS — L218 Other seborrheic dermatitis: Secondary | ICD-10-CM | POA: Diagnosis not present

## 2020-11-20 DIAGNOSIS — L710 Perioral dermatitis: Secondary | ICD-10-CM | POA: Diagnosis not present

## 2020-11-20 DIAGNOSIS — L2084 Intrinsic (allergic) eczema: Secondary | ICD-10-CM | POA: Diagnosis not present

## 2020-11-20 DIAGNOSIS — D225 Melanocytic nevi of trunk: Secondary | ICD-10-CM | POA: Diagnosis not present

## 2020-12-03 DIAGNOSIS — F900 Attention-deficit hyperactivity disorder, predominantly inattentive type: Secondary | ICD-10-CM | POA: Diagnosis not present

## 2020-12-29 ENCOUNTER — Other Ambulatory Visit: Payer: Self-pay | Admitting: Internal Medicine

## 2021-01-27 DIAGNOSIS — L820 Inflamed seborrheic keratosis: Secondary | ICD-10-CM | POA: Diagnosis not present

## 2021-05-18 DIAGNOSIS — F900 Attention-deficit hyperactivity disorder, predominantly inattentive type: Secondary | ICD-10-CM | POA: Diagnosis not present

## 2021-08-06 ENCOUNTER — Other Ambulatory Visit: Payer: Self-pay | Admitting: Internal Medicine

## 2021-08-06 MED ORDER — HYDROCORTISONE (PERIANAL) 2.5 % EX CREA: 1.0000 "application " | TOPICAL_CREAM | Freq: Two times a day (BID) | CUTANEOUS | 0 refills | Status: AC

## 2021-10-29 LAB — COMPREHENSIVE METABOLIC PANEL
Albumin: 4.8 (ref 3.5–5.0)
Calcium: 9.7 (ref 8.7–10.7)
Globulin: 2.3

## 2021-10-29 LAB — HEPATIC FUNCTION PANEL
ALT: 15 U/L (ref 7–35)
AST: 25 (ref 13–35)
Alkaline Phosphatase: 53 (ref 25–125)
Bilirubin, Total: 0.4

## 2021-10-29 LAB — BASIC METABOLIC PANEL
BUN: 10 (ref 4–21)
Chloride: 101 (ref 99–108)
Creatinine: 0.8 (ref 0.5–1.1)
Glucose: 98
Potassium: 4.5 mEq/L (ref 3.5–5.1)
Sodium: 140 (ref 137–147)

## 2021-10-29 LAB — LIPID PANEL
Cholesterol: 184 (ref 0–200)
HDL: 73 — AB (ref 35–70)
LDL Cholesterol: 99
Triglycerides: 62 (ref 40–160)

## 2021-10-29 LAB — TSH: TSH: 1.69 (ref 0.41–5.90)

## 2021-11-04 DIAGNOSIS — D751 Secondary polycythemia: Secondary | ICD-10-CM | POA: Diagnosis not present

## 2021-11-04 DIAGNOSIS — E039 Hypothyroidism, unspecified: Secondary | ICD-10-CM | POA: Diagnosis not present

## 2021-11-04 DIAGNOSIS — E063 Autoimmune thyroiditis: Secondary | ICD-10-CM | POA: Diagnosis not present

## 2021-11-10 DIAGNOSIS — F900 Attention-deficit hyperactivity disorder, predominantly inattentive type: Secondary | ICD-10-CM | POA: Diagnosis not present

## 2021-11-10 DIAGNOSIS — Z1231 Encounter for screening mammogram for malignant neoplasm of breast: Secondary | ICD-10-CM | POA: Diagnosis not present

## 2021-11-10 DIAGNOSIS — Z124 Encounter for screening for malignant neoplasm of cervix: Secondary | ICD-10-CM | POA: Diagnosis not present

## 2021-11-10 DIAGNOSIS — Z01419 Encounter for gynecological examination (general) (routine) without abnormal findings: Secondary | ICD-10-CM | POA: Diagnosis not present

## 2021-11-10 DIAGNOSIS — Z6821 Body mass index (BMI) 21.0-21.9, adult: Secondary | ICD-10-CM | POA: Diagnosis not present

## 2021-11-11 ENCOUNTER — Other Ambulatory Visit: Payer: Self-pay | Admitting: Obstetrics and Gynecology

## 2021-11-11 DIAGNOSIS — R928 Other abnormal and inconclusive findings on diagnostic imaging of breast: Secondary | ICD-10-CM

## 2021-11-15 ENCOUNTER — Encounter: Payer: Self-pay | Admitting: Internal Medicine

## 2021-11-15 DIAGNOSIS — R928 Other abnormal and inconclusive findings on diagnostic imaging of breast: Secondary | ICD-10-CM | POA: Diagnosis not present

## 2021-11-15 DIAGNOSIS — R922 Inconclusive mammogram: Secondary | ICD-10-CM | POA: Diagnosis not present

## 2021-11-15 NOTE — Telephone Encounter (Signed)
Cpe scheduled 11/22/21 ?

## 2021-11-21 NOTE — Progress Notes (Signed)
Chief Complaint  Patient presents with   Annual Exam    HPI: Patient  Toni Bradley  51 y.o. comes in today for [place] visit  Has a copy of lab test done for work a full panel only abnormalities are H&Helevated 16.1/47.1 upper limit range 15/46.6  Has  hormone pellet.  Under treatment for about 7 years.  With low-dose testosterone estrogen and takes oral progesterone.  And it gets it changed every 3 to 5 months to get it changed next week her hormonal treatment has helped her with sleep and feeling better all over. Sees dr Chalmers Cater  thryoid is normal  Feels like choking acid  at times.  Over the last few weeks tried  prilosec. 3-4 days not helping .  Was hard to eat  early dinner .  Sometimes feels gaggy but no vomiting or nausea.  Dr. Michiel Sites did not seem to be worried.  No unintended weight loss works out no vomiting or actual heartburn or cough.   Health Maintenance  Topic Date Due   COVID-19 Vaccine (1) 05/25/2022 (Originally 10/15/1971)   Hepatitis C Screening  05/25/2022 (Originally 04/16/1990)   HIV Screening  05/25/2022 (Originally 04/17/1987)   INFLUENZA VACCINE  02/01/2022   PAP SMEAR-Modifier  11/10/2024   TETANUS/TDAP  12/07/2026   COLONOSCOPY (Pts 45-36yr Insurance coverage will need to be confirmed)  09/16/2027   HPV VACCINES  Aged Out   Health Maintenance Review LIFESTYLE:  Exercise:   work out 4 d per week.  Gym and  weight  Tobacco/ETS: no Alcohol:   3 per week  Sugar beverages:   no Sleep: 5-6 hours   Drug use: no HH of  2  +  2 dogs  Work: 5-6  12 hours  from home.     ROS:  GEN/ HEENT: No fever, significant weight changes sweats headaches vision problems hearing changes, CV/ PULM; No chest pain shortness of breath cough, syncope,edema  change in exercise tolerance. GI /GU: No adominal pain, vomiting, change in bowel habits. No blood in the stool. No significant GU symptoms. SKIN/HEME: ,no acute skin rashes suspicious lesions or  bleeding. No lymphadenopathy, nodules, masses.  NEURO/ PSYCH:  No neurologic signs such as weakness numbness. No depression anxiety. IMM/ Allergy: No unusual infections.  Allergy .   REST of 12 system review negative except as per HPI   Past Medical History:  Diagnosis Date   Dry skin    Hx of colonic polyp - ssp    Mood disorder (HCC)    Nasal polyps    PONV (postoperative nausea and vomiting)    itching and PONV post op    Problems related to lack of adequate sleep    Recurrent HSV (herpes simplex virus)    Thyroid disease     Past Surgical History:  Procedure Laterality Date   BREAST ENHANCEMENT SURGERY     CESAREAN SECTION  2002   childbirth  1998   vaginal   NASAL POLYP SURGERY      ovarian cyst sx  1996   WISDOM TOOTH EXTRACTION      Family History  Problem Relation Age of Onset   Diabetes Other    Thyroid disease Other        Sister   Other Father        Parathyroid   Breast cancer Sister    Colon cancer Neg Hx    Colon polyps Neg Hx    Esophageal cancer Neg Hx  Rectal cancer Neg Hx    Stomach cancer Neg Hx     Social History   Socioeconomic History   Marital status: Married    Spouse name: Not on file   Number of children: Not on file   Years of education: Not on file   Highest education level: Not on file  Occupational History   Not on file  Tobacco Use   Smoking status: Never   Smokeless tobacco: Never  Vaping Use   Vaping Use: Never used  Substance and Sexual Activity   Alcohol use: Yes    Comment: occ.   Drug use: No   Sexual activity: Yes  Other Topics Concern   Not on file  Social History Narrative   Married with children   Going to school.[name]...job real estate   hh OF 4  [place]   No ets.    Work out 5 x per week.    6-7 hours          Social Determinants of Health   Financial Resource Strain: Not on file  Food Insecurity: Not on file  Transportation Needs: Not on file  Physical Activity: Not on file   Stress: Not on file  Social Connections: Not on file    Outpatient Medications Prior to Visit  Medication Sig Dispense Refill   ADDERALL XR 30 MG 24 hr capsule Take by mouth every morning.     ARMOUR THYROID 15 MG tablet 75 mg.  6   BIOTIN PO Take by mouth.     clobetasol cream (TEMOVATE) 0.05 % APPLY AS DIRECTED TO SKIN TWICE A DAY AS NEEDED FOR VERY ITCHY AREAS     Clocortolone Pivalate (CLODERM) 0.1 % cream Cloderm 0.1 % topical cream  APPLY TOPICALLY TO THE AFFECTED AREA TWICE DAILY     desonide (DESOWEN) 0.05 % ointment Apply 1 application topically 2 (two) times daily as needed. On the rashes. Stop when clear. 15 g 2   fexofenadine (ALLEGRA) 180 MG tablet Take 180 mg by mouth daily.     Fluocinolone Acetonide Scalp 0.01 % OIL Apply topically.     fluticasone (FLONASE) 50 MCG/ACT nasal spray fluticasone propionate 50 mcg/actuation nasal spray,suspension  USE 1 SPRAY IN EACH NOSTRIL TWICE A DAY     hydrocortisone (ANUSOL-HC) 2.5 % rectal cream Place 1 application rectally 2 (two) times daily. No more than 2 weeks at a time 28 g 0   hydrocortisone 2.5 % ointment Apply topically.     ipratropium (ATROVENT) 0.06 % nasal spray ipratropium bromide 42 mcg (0.06 %) nasal spray  PLACE 2 SPRAYS EACH NOSTRIL 3 4 X PER DAY     ketoconazole (NIZORAL) 2 % shampoo ketoconazole 2 % shampoo     OVER THE COUNTER MEDICATION CBS Immune Proteins nightly     OVER THE COUNTER MEDICATION Rootcology S Boulzerdii     progesterone (PROMETRIUM) 100 MG capsule TAKE 2 CAPSULES BY MOUTH EVERY NIGHT  1   valACYclovir (VALTREX) 500 MG tablet Take 1 tablet (500 mg total) by mouth 2 (two) times daily. 60 tablet 1   VITAMIN D PO Take by mouth.     No facility-administered medications prior to visit.     EXAM:  BP 114/70 (BP Location: Left Arm, Patient Position: Sitting, Cuff Size: Normal)   Pulse 74   Temp 98.3 F (36.8 C) (Oral)   Ht 5' 2.75" (1.594 m)   Wt 120 lb 12.8 oz (54.8 kg)  LMP 12/02/2016  (Exact Date)   SpO2 99%   BMI 21.57 kg/m   Body mass index is 21.57 kg/m. Wt Readings from Last 3 Encounters:  11/22/21 120 lb 12.8 oz (54.8 kg)  09/15/20 127 lb 12.8 oz (58 kg)  09/01/20 127 lb 12.8 oz (58 kg)    Physical Exam: Vital signs reviewed DTO:IZTI is a well-developed well-nourished alert cooperative    who appearsr stated age in no acute distress.  HEENT: normocephalic atraumatic , Eyes: PERRL EOM's full, conjunctiva clear, Nares: paten,t no deformity discharge or tenderness., Ears: no deformity EAC's clear TMs with normal landmarks. NECK: supple without masses, thyromegaly or bruits. Thyroid palp no masses  [place] notch  CHEST/PULM:  Clear to auscultation and percussion breath sounds equal no wheeze , rales or rhonchi. No chest wall deformities or tenderness. Breast: normal by inspection . No dimpling, discharge, masses, tenderness or discharge .implant  CV: PMI is nondisplaced, S1 S2 no gallops, murmurs, rubs. Peripheral pulses are full without delay.No JVD .  ABDOMEN: Bowel sounds normal nontender  No guard or rebound, no hepato splenomegal no CVA tenderness.  Extremtities:  No clubbing cyanosis or edema, no acute joint swelling or redness no focal atrophy NEURO:  Oriented x3, cranial nerves 3-12 appear to be intact, no obvious focal weakness,gait within normal limits no abnormal reflexes or asymmetrical SKIN: No acute rashes normal turgor, color, no bruising or petechiae. PSYCH: Oriented, good eye contact, no obvious depression anxiety, cognition and judgment appear normal. LN: no cervical axillary inguinal adenopathy  Lab Results  Component Value Date   WBC 5.6 11/22/2021   HGB 14.6 11/22/2021   HCT 43.1 11/22/2021   PLT 280.0 11/22/2021   GLUCOSE 96 05/20/2019   CHOL 203 (H) 05/20/2019   TRIG 80.0 05/20/2019   HDL 63.80 05/20/2019   LDLCALC 124 (H) 05/20/2019   ALT 16 05/20/2019   AST 22 05/20/2019   NA 140 05/20/2019   K 4.1 05/20/2019   CL 100 05/20/2019    CREATININE 0.96 05/20/2019   BUN 14 05/20/2019   CO2 30 05/20/2019   TSH 1.08 05/20/2019    BP Readings from Last 3 Encounters:  11/22/21 114/70  09/15/20 101/62  09/05/19 112/68    Lab results reviewed to be abstracted and scanned in   nl lipids thyroid lfts chem vit d   ASSESSMENT AND PLAN:  Discussed the following assessment and plan:    ICD-10-CM   1. Visit for preventive health examination  Z00.00     2. Throat symptom  R68.89    Consider pill induced esophageal no alarm findings try PPI longer care with pill swallowing follow-up progress consider further evaluation if continued problem.    3. Hypothyroidism due to Hashimoto's thyroiditis  E03.8    E06.3     4. Medication management  Z79.899     5. Elevated hemoglobin (HCC)  D58.2 CBC with Differential/Platelet    CBC with Differential/Platelet    Elevated hemoglobin is borderline could be from testosterone therapy no evidence of sleep apnea or other blood diseases recheck CBC and differential today hydrated we will follow.   Return for depending on results and how doing . [name]  Patient Care Team: Makaelyn Aponte, [name], MD as PCP - General (Internal Medicine) Louretta Shorten, MD as Consulting Physician (Obstetrics and Gynecology) Jacelyn Pi, MD as Consulting Physician (Endocrinology) Patient Instructions  Good to see  you today . Your exam Is good.   Testosterone could raise  hg hct but you  have not had this before . Limit avoid supplements incase adding  ( vitamins ok ) Caution  when swallowing pills  to avoid  throat symptoms  Anxiety can also add to sx  Make sure hydrated ....  Take omeprazole  or nexium otc  30 - 60 min pre meal daily ofr 2 weeks then give update  . And go from there .   [name]. Sreshta Cressler M.D.

## 2021-11-22 ENCOUNTER — Encounter: Payer: Self-pay | Admitting: Internal Medicine

## 2021-11-22 ENCOUNTER — Ambulatory Visit (INDEPENDENT_AMBULATORY_CARE_PROVIDER_SITE_OTHER): Payer: BC Managed Care – PPO | Admitting: Internal Medicine

## 2021-11-22 VITALS — BP 114/70 | HR 74 | Temp 98.3°F | Ht 62.75 in | Wt 120.8 lb

## 2021-11-22 DIAGNOSIS — Z0000 Encounter for general adult medical examination without abnormal findings: Secondary | ICD-10-CM | POA: Diagnosis not present

## 2021-11-22 DIAGNOSIS — Z79899 Other long term (current) drug therapy: Secondary | ICD-10-CM | POA: Diagnosis not present

## 2021-11-22 DIAGNOSIS — E038 Other specified hypothyroidism: Secondary | ICD-10-CM

## 2021-11-22 DIAGNOSIS — R6889 Other general symptoms and signs: Secondary | ICD-10-CM | POA: Diagnosis not present

## 2021-11-22 DIAGNOSIS — D582 Other hemoglobinopathies: Secondary | ICD-10-CM | POA: Diagnosis not present

## 2021-11-22 DIAGNOSIS — E063 Autoimmune thyroiditis: Secondary | ICD-10-CM

## 2021-11-22 LAB — CBC WITH DIFFERENTIAL/PLATELET
Basophils Absolute: 0 10*3/uL (ref 0.0–0.1)
Basophils Relative: 0.5 % (ref 0.0–3.0)
Eosinophils Absolute: 0.1 10*3/uL (ref 0.0–0.7)
Eosinophils Relative: 2 % (ref 0.0–5.0)
HCT: 43.1 % (ref 36.0–46.0)
Hemoglobin: 14.6 g/dL (ref 12.0–15.0)
Lymphocytes Relative: 27.9 % (ref 12.0–46.0)
Lymphs Abs: 1.6 10*3/uL (ref 0.7–4.0)
MCHC: 33.9 g/dL (ref 30.0–36.0)
MCV: 94.5 fl (ref 78.0–100.0)
Monocytes Absolute: 0.4 10*3/uL (ref 0.1–1.0)
Monocytes Relative: 7 % (ref 3.0–12.0)
Neutro Abs: 3.5 10*3/uL (ref 1.4–7.7)
Neutrophils Relative %: 62.6 % (ref 43.0–77.0)
Platelets: 280 10*3/uL (ref 150.0–400.0)
RBC: 4.56 Mil/uL (ref 3.87–5.11)
RDW: 12.5 % (ref 11.5–15.5)
WBC: 5.6 10*3/uL (ref 4.0–10.5)

## 2021-11-22 NOTE — Patient Instructions (Addendum)
Good to see  you today . Your exam Is good.   Testosterone could raise  hg hct but you have not had this before . Limit avoid supplements incase adding  ( vitamins ok ) Caution  when swallowing pills  to avoid  throat symptoms  Anxiety can also add to sx  Make sure hydrated ....  Take omeprazole  or nexium otc  30 - 60 min pre meal daily ofr 2 weeks then give update  . And go from there .

## 2021-11-24 NOTE — Progress Notes (Signed)
Hemoglobin and hematocrit are now normal in this sample no need to repeat at this time.

## 2021-11-25 ENCOUNTER — Other Ambulatory Visit: Payer: BC Managed Care – PPO

## 2021-11-25 ENCOUNTER — Encounter: Payer: Self-pay | Admitting: Internal Medicine

## 2023-08-10 ENCOUNTER — Telehealth: Payer: Self-pay | Admitting: Internal Medicine

## 2023-08-10 NOTE — Telephone Encounter (Signed)
 Called pt and left vm to r/s physical due to error in scheduling, provider has already reached her max for physicals that day. Please r/s pt's physical appt.

## 2023-08-21 NOTE — Progress Notes (Unsigned)
 No chief complaint on file.   HPI: Patient  Toni Bradley  53 y.o. comes in today for Preventive Health Care visit   Health Maintenance  Topic Date Due   COVID-19 Vaccine (1) Never done   HIV Screening  Never done   Hepatitis C Screening  Never done   Zoster Vaccines- Shingrix (1 of 2) Never done   Cervical Cancer Screening (HPV/Pap Cotest)  03/04/2012   MAMMOGRAM  04/16/2022   INFLUENZA VACCINE  02/02/2023   DTaP/Tdap/Td (2 - Tdap) 12/07/2026   Colonoscopy  09/16/2027   HPV VACCINES  Aged Out   Health Maintenance Review LIFESTYLE:  Exercise:   Tobacco/ETS: Alcohol:  Sugar beverages: Sleep: Drug use: no HH of  Work:    ROS:  GEN/ HEENT: No fever, significant weight changes sweats headaches vision problems hearing changes, CV/ PULM; No chest pain shortness of breath cough, syncope,edema  change in exercise tolerance. GI /GU: No adominal pain, vomiting, change in bowel habits. No blood in the stool. No significant GU symptoms. SKIN/HEME: ,no acute skin rashes suspicious lesions or bleeding. No lymphadenopathy, nodules, masses.  NEURO/ PSYCH:  No neurologic signs such as weakness numbness. No depression anxiety. IMM/ Allergy: No unusual infections.  Allergy .   REST of 12 system review negative except as per HPI   Past Medical History:  Diagnosis Date   Dry skin    Hx of colonic polyp - ssp    Mood disorder (HCC)    Nasal polyps    PONV (postoperative nausea and vomiting)    itching and PONV post op    Problems related to lack of adequate sleep    Recurrent HSV (herpes simplex virus)    Thyroid disease     Past Surgical History:  Procedure Laterality Date   BREAST ENHANCEMENT SURGERY     CESAREAN SECTION  2002   childbirth  1998   vaginal   NASAL POLYP SURGERY      ovarian cyst sx  1996   WISDOM TOOTH EXTRACTION      Family History  Problem Relation Age of Onset   Diabetes Other    Thyroid disease Other        Sister   Other Father         Parathyroid   Breast cancer Sister    Colon cancer Neg Hx    Colon polyps Neg Hx    Esophageal cancer Neg Hx    Rectal cancer Neg Hx    Stomach cancer Neg Hx     Social History   Socioeconomic History   Marital status: Married    Spouse name: Not on file   Number of children: Not on file   Years of education: Not on file   Highest education level: Not on file  Occupational History   Not on file  Tobacco Use   Smoking status: Never   Smokeless tobacco: Never  Vaping Use   Vaping status: Never Used  Substance and Sexual Activity   Alcohol use: Yes    Comment: occ.   Drug use: No   Sexual activity: Yes  Other Topics Concern   Not on file  Social History Narrative   Married with children   Going to school.[name]...job real estate   hh OF 4  [employer]     [employer]   No ets.    Work out 5 x per week.    6-7 hours  Social Drivers of Corporate investment banker Strain: Not on file  Food Insecurity: Not on file  Transportation Needs: Not on file  Physical Activity: Not on file  Stress: Not on file (05/10/2023)  Social Connections: Unknown (11/11/2021)   Received from [facility]   Social Network    Social Network: Not on file    Outpatient Medications Prior to Visit  Medication Sig Dispense Refill   ADDERALL XR 30 MG 24 hr capsule Take by mouth every morning.     ARMOUR THYROID 15 MG tablet 75 mg.  6   BIOTIN PO Take by mouth.     clobetasol cream (TEMOVATE) 0.05 % APPLY AS DIRECTED TO SKIN TWICE A DAY AS NEEDED FOR VERY ITCHY AREAS     Clocortolone Pivalate (CLODERM) 0.1 % cream Cloderm 0.1 % topical cream  APPLY TOPICALLY TO THE AFFECTED AREA TWICE DAILY     desonide (DESOWEN) 0.05 % ointment Apply 1 application topically 2 (two) times daily as needed. On the rashes. Stop when clear. 15 g 2   fexofenadine (ALLEGRA) 180 MG tablet Take 180 mg by mouth daily.     Fluocinolone Acetonide Scalp 0.01 % OIL Apply topically.     fluticasone (FLONASE) 50 MCG/ACT nasal  spray fluticasone propionate 50 mcg/actuation nasal spray,suspension  USE 1 SPRAY IN EACH NOSTRIL TWICE A DAY     hydrocortisone (ANUSOL-HC) 2.5 % rectal cream Place 1 application rectally 2 (two) times daily. No more than 2 weeks at a time 28 g 0   hydrocortisone 2.5 % ointment Apply topically.     ipratropium (ATROVENT) 0.06 % nasal spray ipratropium bromide 42 mcg (0.06 %) nasal spray  PLACE 2 SPRAYS EACH NOSTRIL 3 4 X PER DAY     ketoconazole (NIZORAL) 2 % shampoo ketoconazole 2 % shampoo     OVER THE COUNTER MEDICATION CBS Immune Proteins nightly     OVER THE COUNTER MEDICATION Rootcology S Boulzerdii     progesterone (PROMETRIUM) 100 MG capsule TAKE 2 CAPSULES BY MOUTH EVERY NIGHT  1   valACYclovir (VALTREX) 500 MG tablet Take 1 tablet (500 mg total) by mouth 2 (two) times daily. 60 tablet 1   VITAMIN D PO Take by mouth.     No facility-administered medications prior to visit.     EXAM:  LMP 12/02/2016 (Exact Date)   There is no height or weight on file to calculate BMI. Wt Readings from Last 3 Encounters:  11/22/21 120 lb 12.8 oz (54.8 kg)  09/15/20 127 lb 12.8 oz (58 kg)  09/01/20 127 lb 12.8 oz (58 kg)    Physical Exam: Vital signs reviewed WGN:FAOZ is a well-developed well-nourished alert cooperative    who appearsr stated age in no acute distress.  HEENT: normocephalic atraumatic , Eyes: PERRL EOM's full, conjunctiva clear, Nares: paten,t no deformity discharge or tenderness., Ears: no deformity EAC's clear TMs with normal landmarks. Mouth: clear OP, no lesions, edema.  Moist mucous membranes. Dentition in adequate repair. NECK: supple without masses, thyromegaly or bruits. CHEST/PULM:  Clear to auscultation and percussion breath sounds equal no wheeze , rales or rhonchi. No chest wall deformities or tenderness. Breast: normal by inspection . No dimpling, discharge, masses, tenderness or discharge . CV: PMI is nondisplaced, S1 S2 no gallops, murmurs, rubs. Peripheral  pulses are full without delay.No JVD .  ABDOMEN: Bowel sounds normal nontender  No guard or rebound, no hepato splenomegal no CVA tenderness.  No hernia. Extremtities:  No clubbing cyanosis or  edema, no acute joint swelling or redness no focal atrophy NEURO:  Oriented x3, cranial nerves 3-12 appear to be intact, no obvious focal weakness,gait within normal limits no abnormal reflexes or asymmetrical SKIN: No acute rashes normal turgor, color, no bruising or petechiae. PSYCH: Oriented, good eye contact, no obvious depression anxiety, cognition and judgment appear normal. LN: no cervical axillary adenopathy  Lab Results  Component Value Date   WBC 5.6 11/22/2021   HGB 14.6 11/22/2021   HCT 43.1 11/22/2021   PLT 280.0 11/22/2021   GLUCOSE 96 05/20/2019   CHOL 184 10/29/2021   TRIG 62 10/29/2021   HDL 73 (A) 10/29/2021   LDLCALC 99 10/29/2021   ALT 15 10/29/2021   AST 25 10/29/2021   NA 140 10/29/2021   K 4.5 10/29/2021   CL 101 10/29/2021   CREATININE 0.8 10/29/2021   BUN 10 10/29/2021   CO2 30 05/20/2019   TSH 1.69 10/29/2021    BP Readings from Last 3 Encounters:  11/22/21 114/70  09/15/20 101/62  09/05/19 112/68    Lab results reviewed with patient   ASSESSMENT AND PLAN:  Discussed the following assessment and plan:  No diagnosis found. No follow-ups on file.  Patient Care Team: Floy Riegler, Neta Mends, MD as PCP - General (Internal Medicine) Candice Camp, MD as Consulting Physician (Obstetrics and Gynecology) Dorisann Frames, MD as Consulting Physician (Endocrinology) There are no Patient Instructions on file for this visit.  Neta Mends. Tahsin Benyo M.D.

## 2023-08-22 ENCOUNTER — Ambulatory Visit (INDEPENDENT_AMBULATORY_CARE_PROVIDER_SITE_OTHER): Payer: No Typology Code available for payment source | Admitting: Internal Medicine

## 2023-08-22 ENCOUNTER — Encounter: Payer: Self-pay | Admitting: Internal Medicine

## 2023-08-22 VITALS — BP 100/68 | HR 94 | Temp 98.1°F | Ht 62.75 in | Wt 122.8 lb

## 2023-08-22 DIAGNOSIS — Z1159 Encounter for screening for other viral diseases: Secondary | ICD-10-CM

## 2023-08-22 DIAGNOSIS — D582 Other hemoglobinopathies: Secondary | ICD-10-CM | POA: Diagnosis not present

## 2023-08-22 DIAGNOSIS — Z79890 Hormone replacement therapy (postmenopausal): Secondary | ICD-10-CM | POA: Diagnosis not present

## 2023-08-22 DIAGNOSIS — E063 Autoimmune thyroiditis: Secondary | ICD-10-CM | POA: Diagnosis not present

## 2023-08-22 DIAGNOSIS — Z79899 Other long term (current) drug therapy: Secondary | ICD-10-CM | POA: Diagnosis not present

## 2023-08-22 DIAGNOSIS — Z0000 Encounter for general adult medical examination without abnormal findings: Secondary | ICD-10-CM | POA: Diagnosis not present

## 2023-08-22 DIAGNOSIS — Z114 Encounter for screening for human immunodeficiency virus [HIV]: Secondary | ICD-10-CM

## 2023-08-22 LAB — CBC WITH DIFFERENTIAL/PLATELET
Basophils Absolute: 0 10*3/uL (ref 0.0–0.1)
Basophils Relative: 0.3 % (ref 0.0–3.0)
Eosinophils Absolute: 0.1 10*3/uL (ref 0.0–0.7)
Eosinophils Relative: 1.1 % (ref 0.0–5.0)
HCT: 41.7 % (ref 36.0–46.0)
Hemoglobin: 14 g/dL (ref 12.0–15.0)
Lymphocytes Relative: 18.1 % (ref 12.0–46.0)
Lymphs Abs: 1.4 10*3/uL (ref 0.7–4.0)
MCHC: 33.7 g/dL (ref 30.0–36.0)
MCV: 96.5 fL (ref 78.0–100.0)
Monocytes Absolute: 0.4 10*3/uL (ref 0.1–1.0)
Monocytes Relative: 5.3 % (ref 3.0–12.0)
Neutro Abs: 5.8 10*3/uL (ref 1.4–7.7)
Neutrophils Relative %: 75.2 % (ref 43.0–77.0)
Platelets: 331 10*3/uL (ref 150.0–400.0)
RBC: 4.32 Mil/uL (ref 3.87–5.11)
RDW: 12.2 % (ref 11.5–15.5)
WBC: 7.7 10*3/uL (ref 4.0–10.5)

## 2023-08-22 LAB — COMPREHENSIVE METABOLIC PANEL
ALT: 18 U/L (ref 0–35)
AST: 27 U/L (ref 0–37)
Albumin: 4.6 g/dL (ref 3.5–5.2)
Alkaline Phosphatase: 42 U/L (ref 39–117)
BUN: 20 mg/dL (ref 6–23)
CO2: 29 meq/L (ref 19–32)
Calcium: 9.2 mg/dL (ref 8.4–10.5)
Chloride: 101 meq/L (ref 96–112)
Creatinine, Ser: 0.84 mg/dL (ref 0.40–1.20)
GFR: 80.5 mL/min (ref >60.00)
Glucose, Bld: 96 mg/dL (ref 70–99)
Potassium: 4.1 meq/L (ref 3.5–5.1)
Sodium: 138 meq/L (ref 135–145)
Total Bilirubin: 0.7 mg/dL (ref 0.2–1.2)
Total Protein: 7.3 g/dL (ref 6.0–8.3)

## 2023-08-22 LAB — LIPID PANEL
Cholesterol: 192 mg/dL (ref 0–200)
HDL: 72.1 mg/dL (ref >39.00)
LDL Cholesterol: 111 mg/dL — ABNORMAL HIGH (ref 0–99)
NonHDL: 120.18
Total CHOL/HDL Ratio: 3
Triglycerides: 48 mg/dL (ref 0.0–149.0)
VLDL: 9.6 mg/dL (ref 0.0–40.0)

## 2023-08-22 LAB — TESTOSTERONE: Testosterone: 143.95 ng/dL — ABNORMAL HIGH (ref 15.00–40.00)

## 2023-08-22 LAB — FOLLICLE STIMULATING HORMONE: FSH: 53 m[IU]/mL

## 2023-08-22 LAB — LUTEINIZING HORMONE: LH: 19.22 m[IU]/mL

## 2023-08-22 LAB — TSH: TSH: 2.16 u[IU]/mL (ref 0.35–5.50)

## 2023-08-22 MED ORDER — TRIAMCINOLONE ACETONIDE 0.1 % EX OINT: TOPICAL_OINTMENT | CUTANEOUS | 1 refills | Status: AC

## 2023-08-22 NOTE — Patient Instructions (Signed)
 Good to see you today   Continue resistance exercise  and healthy eating  Shingrix when possible . Lab today

## 2023-08-23 LAB — PROGESTERONE: Progesterone: <0.5 ng/mL

## 2023-08-23 LAB — HIV ANTIBODY (ROUTINE TESTING W REFLEX): HIV 1&2 Ab, 4th Generation: NONREACTIVE

## 2023-08-23 LAB — HEPATITIS C ANTIBODY: Hepatitis C Ab: NONREACTIVE

## 2023-08-23 LAB — ESTRADIOL: Estradiol: 57 pg/mL

## 2023-08-24 ENCOUNTER — Encounter: Payer: Self-pay | Admitting: Internal Medicine

## 2023-08-24 NOTE — Progress Notes (Signed)
 Results  are stable or normal  except testosterone level is high  Although ldl is over 100  still low risk  10 years  for ascvd  Forwarding info to dr Rana Snare and Talmage Nap The 10-year ASCVD risk score (Arnett DK, et al., 2019) is: 0.6%   Values used to calculate the score:     Age: 53 years     Sex: Female     Is Non-Hispanic African American: No     Diabetic: No     Tobacco smoker: No     Systolic Blood Pressure: 100 mmHg     Is BP treated: No     HDL Cholesterol: 72.1 mg/dL     Total Cholesterol: 192 mg/dL

## 2023-11-17 ENCOUNTER — Encounter: Payer: Self-pay | Admitting: Internal Medicine
